# Patient Record
Sex: Female | Born: 1975 | Race: White | Hispanic: No | Marital: Married | Smoking: Current every day smoker
Health system: Southern US, Community
[De-identification: ages and names within clinical notes are randomized; demographics above are authoritative.]

## PROBLEM LIST (undated history)

## (undated) DIAGNOSIS — N200 Calculus of kidney: Secondary | ICD-10-CM

## (undated) DIAGNOSIS — I10 Essential (primary) hypertension: Secondary | ICD-10-CM

## (undated) DIAGNOSIS — F419 Anxiety disorder, unspecified: Secondary | ICD-10-CM

## (undated) DIAGNOSIS — F32A Depression, unspecified: Secondary | ICD-10-CM

## (undated) DIAGNOSIS — N946 Dysmenorrhea, unspecified: Secondary | ICD-10-CM

## (undated) HISTORY — DX: Dysmenorrhea, unspecified: N94.6

## (undated) HISTORY — DX: Anxiety disorder, unspecified: F41.9

## (undated) HISTORY — DX: Essential (primary) hypertension: I10

## (undated) HISTORY — DX: Depression, unspecified: F32.A

---

## 2011-09-22 DIAGNOSIS — F509 Eating disorder, unspecified: Secondary | ICD-10-CM | POA: Insufficient documentation

## 2011-09-22 DIAGNOSIS — J309 Allergic rhinitis, unspecified: Secondary | ICD-10-CM | POA: Insufficient documentation

## 2011-09-22 DIAGNOSIS — L309 Dermatitis, unspecified: Secondary | ICD-10-CM | POA: Insufficient documentation

## 2011-09-22 DIAGNOSIS — F3342 Major depressive disorder, recurrent, in full remission: Secondary | ICD-10-CM | POA: Insufficient documentation

## 2011-09-22 DIAGNOSIS — M545 Low back pain, unspecified: Secondary | ICD-10-CM | POA: Insufficient documentation

## 2011-09-22 DIAGNOSIS — F331 Major depressive disorder, recurrent, moderate: Secondary | ICD-10-CM | POA: Insufficient documentation

## 2011-12-01 DIAGNOSIS — I10 Essential (primary) hypertension: Secondary | ICD-10-CM | POA: Insufficient documentation

## 2012-10-29 DIAGNOSIS — E269 Hyperaldosteronism, unspecified: Secondary | ICD-10-CM | POA: Insufficient documentation

## 2015-01-20 DIAGNOSIS — Z8742 Personal history of other diseases of the female genital tract: Secondary | ICD-10-CM | POA: Insufficient documentation

## 2015-01-20 DIAGNOSIS — R87610 Atypical squamous cells of undetermined significance on cytologic smear of cervix (ASC-US): Secondary | ICD-10-CM | POA: Insufficient documentation

## 2015-06-08 DIAGNOSIS — M84375A Stress fracture, left foot, initial encounter for fracture: Secondary | ICD-10-CM | POA: Insufficient documentation

## 2015-09-11 DIAGNOSIS — J209 Acute bronchitis, unspecified: Secondary | ICD-10-CM | POA: Insufficient documentation

## 2015-09-11 DIAGNOSIS — N200 Calculus of kidney: Secondary | ICD-10-CM | POA: Insufficient documentation

## 2015-09-11 DIAGNOSIS — I152 Hypertension secondary to endocrine disorders: Secondary | ICD-10-CM | POA: Insufficient documentation

## 2015-09-21 DIAGNOSIS — Z789 Other specified health status: Secondary | ICD-10-CM | POA: Insufficient documentation

## 2015-10-19 DIAGNOSIS — R790 Abnormal level of blood mineral: Secondary | ICD-10-CM | POA: Insufficient documentation

## 2016-03-18 DIAGNOSIS — G4700 Insomnia, unspecified: Secondary | ICD-10-CM | POA: Insufficient documentation

## 2016-07-04 DIAGNOSIS — N926 Irregular menstruation, unspecified: Secondary | ICD-10-CM | POA: Insufficient documentation

## 2017-01-16 ENCOUNTER — Emergency Department (HOSPITAL_COMMUNITY): Payer: BLUE CROSS/BLUE SHIELD

## 2017-01-16 ENCOUNTER — Emergency Department (HOSPITAL_COMMUNITY)
Admission: EM | Admit: 2017-01-16 | Discharge: 2017-01-16 | Disposition: A | Discharge status: Home / Self Care | Payer: BLUE CROSS/BLUE SHIELD | Attending: Emergency Medicine | Admitting: Emergency Medicine

## 2017-01-16 ENCOUNTER — Encounter (HOSPITAL_COMMUNITY): Payer: Self-pay | Admitting: Emergency Medicine

## 2017-01-16 DIAGNOSIS — R109 Unspecified abdominal pain: Secondary | ICD-10-CM | POA: Diagnosis present

## 2017-01-16 DIAGNOSIS — N201 Calculus of ureter: Secondary | ICD-10-CM | POA: Insufficient documentation

## 2017-01-16 DIAGNOSIS — Z87891 Personal history of nicotine dependence: Secondary | ICD-10-CM | POA: Insufficient documentation

## 2017-01-16 HISTORY — DX: Calculus of kidney: N20.0

## 2017-01-16 LAB — URINALYSIS, ROUTINE W REFLEX MICROSCOPIC
Bilirubin Urine: NEGATIVE
Glucose, UA: NEGATIVE mg/dL
Ketones, ur: NEGATIVE mg/dL
Leukocytes, UA: NEGATIVE
Nitrite: NEGATIVE
Protein, ur: 30 mg/dL — AB
Specific Gravity, Urine: 1.018 (ref 1.005–1.030)
pH: 8 (ref 5.0–8.0)

## 2017-01-16 LAB — POC URINE PREG, ED: Preg Test, Ur: NEGATIVE

## 2017-01-16 MED ORDER — HYDROMORPHONE HCL 1 MG/ML IJ SOLN
1.0000 mg | Freq: Once | INTRAMUSCULAR | Status: AC
  Administered 2017-01-16: 1 mg via INTRAVENOUS
  Filled 2017-01-16: qty 1

## 2017-01-16 MED ORDER — KETOROLAC TROMETHAMINE 30 MG/ML IJ SOLN
30.0000 mg | Freq: Once | INTRAMUSCULAR | Status: AC
  Administered 2017-01-16: 30 mg via INTRAVENOUS
  Filled 2017-01-16: qty 1

## 2017-01-16 MED ORDER — OXYCODONE-ACETAMINOPHEN 5-325 MG PO TABS: 1.0000 | ORAL_TABLET | Freq: Four times a day (QID) | ORAL | 0 refills | Status: DC | PRN

## 2017-01-16 MED ORDER — PROMETHAZINE HCL 25 MG PO TABS: 25.0000 mg | ORAL_TABLET | Freq: Three times a day (TID) | ORAL | 0 refills | Status: DC | PRN

## 2017-01-16 MED ORDER — ONDANSETRON HCL 4 MG/2ML IJ SOLN
4.0000 mg | Freq: Once | INTRAMUSCULAR | Status: AC
Start: 2017-01-16 — End: 2017-01-16
  Administered 2017-01-16: 4 mg via INTRAVENOUS
  Filled 2017-01-16: qty 2

## 2017-01-16 NOTE — ED Provider Notes (Signed)
[facility]-EMERGENCY DEPT Provider Note   CSN: [id number] Arrival date & time: 01/16/17  0714     History   Chief Complaint Chief Complaint  Patient presents with  . Flank Pain    HPI Brooke Aguilar is a 41 y.o. female.  HPI Patient presents to the emergency department with left flank pain that started late last night and got worse throughout the evening.  The patient states that she took Depo-Provera without significant relief of her symptoms.  Patient, states she has had a ureteral stone in the past about 2 years ago.  States this feels similar.  The patient, states she has also had nausea but no vomiting.  Patient states that nothing seems make the condition better or worse The patient denies chest pain, shortness of breath, headache,blurred vision, neck pain, fever, cough, weakness, numbness, dizziness, anorexia, edema, vomiting, diarrhea, rash, back pain, dysuria, hematemesis, bloody stool, near syncope, or syncope. Past Medical History:  Diagnosis Date  . Kidney stone     There are no active problems to display for this patient.   History reviewed. No pertinent surgical history.  OB History    No data available       Home Medications    Prior to Admission medications   Not on File    Family History No family history on file.  Social History Social History  Substance Use Topics  . Smoking status: Former Smoker    Quit date: 07/31/2014  . Smokeless tobacco: Never Used  . Alcohol use Yes     Comment: 2 per day     Allergies   Patient has no known allergies.   Review of Systems Review of Systems   Physical Exam Updated Vital Signs BP (!) 143/88   Pulse 81   Temp 98.1 F (36.7 C) (Oral)   Resp 16   Ht 5\' 3"  (1.6 m)   Wt 56.7 kg (125 lb)   LMP 01/02/2017   SpO2 100%   BMI 22.14 kg/m   Physical Exam  Constitutional: She is oriented to person, place, and time. She appears well-developed and well-nourished. No distress.  HENT:  Head:  Normocephalic and atraumatic.  Mouth/Throat: Oropharynx is clear and moist.  Eyes: Pupils are equal, round, and reactive to light.  Neck: Normal range of motion. Neck supple.  Cardiovascular: Normal rate, regular rhythm and normal heart sounds.  Exam reveals no gallop and no friction rub.   No murmur heard. Pulmonary/Chest: Effort normal and breath sounds normal. No respiratory distress. She has no wheezes.  Abdominal: Soft. Bowel sounds are normal. She exhibits no distension. There is no tenderness.  Neurological: She is alert and oriented to person, place, and time. She exhibits normal muscle tone. Coordination normal.  Skin: Skin is warm and dry. Capillary refill takes less than 2 seconds. No rash noted. No erythema.  Psychiatric: She has a normal mood and affect. Her behavior is normal.  Nursing note and vitals reviewed.    ED Treatments / Results  Labs (all labs ordered are listed, but only abnormal results are displayed) Labs Reviewed  URINALYSIS, ROUTINE W REFLEX MICROSCOPIC - Abnormal; Notable for the following:       Result Value   APPearance HAZY (*)    Hgb urine dipstick MODERATE (*)    Protein, ur 30 (*)    Bacteria, UA RARE (*)    Squamous Epithelial / LPF 0-5 (*)    All other components within normal limits  PREGNANCY, URINE  EKG  EKG Interpretation None       Radiology No results found.  Procedures Procedures (including critical care time)  Medications Ordered in ED Medications  HYDROmorphone (DILAUDID) injection 1 mg (1 mg Intravenous Given 01/16/17 0758)  ondansetron (ZOFRAN) injection 4 mg (4 mg Intravenous Given 01/16/17 0745)     Initial Impression / Assessment and Plan / ED Course  I have reviewed the triage vital signs and the nursing notes.  Pertinent labs & imaging results that were available during my care of the patient were reviewed by me and considered in my medical decision making (see chart for details).     8:06 AM the patient was  rechecked.  She states that her pain is considerably better at this point, I advised her we are awaiting CT scan.  Patient voiced an understanding and all questions were answered  10:21 AM the patient has what appears to be a 3 mm stone noted in the left distal ureter.  Patient be given Toradol for further pain control.  We will set her up with an appointment with urology.  Told to return here as needed.  Patient agrees the plan and all questions were answered.  Patient is feeling better at this time  Final Clinical Impressions(s) / ED Diagnoses   Final diagnoses:  None    New Prescriptions New Prescriptions   No medications on file     Charlestine NightLawyer, Eder Macek, PA-C 01/16/17 [address]    Daesia Zylka, Long Hillhristopher, PA-C 01/16/17 1050    Doug SouJacubowitz, Sam, MD 01/16/17 [phone]

## 2017-01-16 NOTE — ED Notes (Signed)
ED Provider at bedside. 

## 2017-01-16 NOTE — Discharge Instructions (Signed)
Follow-up with the urologist provided.  Return here as needed.  Increase your fluid intake. °

## 2017-01-16 NOTE — ED Triage Notes (Signed)
PT reports left flank pain that was present when she woke up this morning. The pain did not wake her up. PT reports left flank pain that worsened significantly when she stood and moved around. PT took advil this AM. PT denies N/V. PT reports she has not urinated this AM.

## 2017-01-16 NOTE — ED Notes (Addendum)
PT MADE AWARE SHE CANNOT DRIVE OR OPERATE MACHINERY FOR THE REST OF THE DAY OR WHILE USING OXYCODONE FOR PAIN. PT HAS NO QUESTIONS AT DISCHARGE. PT DENIES WHEELCHAIR TO WAITING ROOM. PT MADE AWARE THAT PROMETHAZINE MAY MAKE HER DROWSY

## 2018-01-10 DIAGNOSIS — Z87892 Personal history of anaphylaxis: Secondary | ICD-10-CM | POA: Insufficient documentation

## 2018-01-10 DIAGNOSIS — Z720 Tobacco use: Secondary | ICD-10-CM | POA: Insufficient documentation

## 2018-01-11 ENCOUNTER — Other Ambulatory Visit: Payer: Self-pay | Admitting: Family Medicine

## 2018-01-11 DIAGNOSIS — Z1231 Encounter for screening mammogram for malignant neoplasm of breast: Secondary | ICD-10-CM

## 2018-02-01 ENCOUNTER — Ambulatory Visit: Payer: BLUE CROSS/BLUE SHIELD

## 2018-02-14 ENCOUNTER — Ambulatory Visit
Admission: RE | Admit: 2018-02-14 | Discharge: 2018-02-14 | Disposition: A | Discharge status: Home / Self Care | Payer: BLUE CROSS/BLUE SHIELD | Source: Ambulatory Visit | Attending: Family Medicine | Admitting: Family Medicine

## 2018-02-14 DIAGNOSIS — Z1231 Encounter for screening mammogram for malignant neoplasm of breast: Secondary | ICD-10-CM

## 2018-02-21 ENCOUNTER — Ambulatory Visit: Payer: Self-pay | Admitting: Allergy and Immunology

## 2019-01-17 ENCOUNTER — Other Ambulatory Visit: Payer: Self-pay | Admitting: Family Medicine

## 2019-01-17 DIAGNOSIS — Z9289 Personal history of other medical treatment: Secondary | ICD-10-CM

## 2019-03-18 ENCOUNTER — Ambulatory Visit: Payer: BLUE CROSS/BLUE SHIELD

## 2019-04-17 ENCOUNTER — Other Ambulatory Visit: Payer: Self-pay | Admitting: Family Medicine

## 2019-04-17 DIAGNOSIS — M84374A Stress fracture, right foot, initial encounter for fracture: Secondary | ICD-10-CM

## 2019-04-24 ENCOUNTER — Other Ambulatory Visit: Payer: Self-pay

## 2019-04-24 ENCOUNTER — Ambulatory Visit
Admission: RE | Admit: 2019-04-24 | Discharge: 2019-04-24 | Disposition: A | Discharge status: Home / Self Care | Payer: BLUE CROSS/BLUE SHIELD | Source: Ambulatory Visit | Attending: Family Medicine | Admitting: Family Medicine

## 2019-04-24 DIAGNOSIS — Z9289 Personal history of other medical treatment: Secondary | ICD-10-CM

## 2019-06-25 ENCOUNTER — Other Ambulatory Visit: Payer: BLUE CROSS/BLUE SHIELD

## 2019-09-04 ENCOUNTER — Other Ambulatory Visit: Payer: Self-pay

## 2019-09-04 ENCOUNTER — Ambulatory Visit
Admission: RE | Admit: 2019-09-04 | Discharge: 2019-09-04 | Disposition: A | Discharge status: Home / Self Care | Payer: BC Managed Care – PPO | Source: Ambulatory Visit | Attending: Family Medicine | Admitting: Family Medicine

## 2019-09-04 DIAGNOSIS — M84374A Stress fracture, right foot, initial encounter for fracture: Secondary | ICD-10-CM

## 2019-09-26 ENCOUNTER — Encounter: Payer: Self-pay | Admitting: Neurology

## 2019-09-27 ENCOUNTER — Other Ambulatory Visit: Payer: Self-pay

## 2019-09-27 DIAGNOSIS — G5603 Carpal tunnel syndrome, bilateral upper limbs: Secondary | ICD-10-CM

## 2019-10-15 ENCOUNTER — Ambulatory Visit (INDEPENDENT_AMBULATORY_CARE_PROVIDER_SITE_OTHER): Payer: BC Managed Care – PPO | Admitting: Neurology

## 2019-10-15 ENCOUNTER — Other Ambulatory Visit: Payer: Self-pay

## 2019-10-15 DIAGNOSIS — G5623 Lesion of ulnar nerve, bilateral upper limbs: Secondary | ICD-10-CM

## 2019-10-15 DIAGNOSIS — G5603 Carpal tunnel syndrome, bilateral upper limbs: Secondary | ICD-10-CM

## 2019-10-15 NOTE — Procedures (Signed)
[facility]  [address], [address]  [place], [place] [address] Tel: [phone] Fax:  [phone] Test Date:  10/15/2019  Patient: Brooke Aguilar DOB: 12-22-1975 Physician: Narda Amber, DO  Sex: Female Height: 5\' 4"  Ref Phys: Elvia Collum, MD  ID#: [id number] Temp: 35.0C Technician:    Patient Complaints: This is a 44 year old female referred for evaluation of bilateral wrist numbness and tingling.  NCV & EMG Findings: Extensive electrodiagnostic testing of the right upper extremity and additional studies of the left shows:  1. Bilateral median, ulnar, and mixed palmar sensory responses are within normal limits. 2. Bilateral median motor responses are within normal limits.  Bilateral ulnar motor responses show decreased conduction velocity across the elbow (A Elbow-B Elbow, L48, R48 m/s).   3. There is no evidence of active or chronic motor axonal loss changes affecting any of the tested muscles.  Motor unit configuration and recruitment pattern is within normal limits.    Impression: 1. Bilateral ulnar neuropathy with slowing across the elbow, purely demyelinating, mild. 2. There is no evidence of a cervical radiculopathy or carpal tunnel syndrome affecting either upper extremity.   ___________________________ Narda Amber, DO    Nerve Conduction Studies Anti Sensory Summary Table   Stim Site NR Peak (ms) Norm Peak (ms) P-T Amp (V) Norm P-T Amp  Left Median Anti Sensory (2nd Digit)  35C  Wrist    2.7 <3.4 51.0 >20  Right Median Anti Sensory (2nd Digit)  35C  Wrist    2.7 <3.4 45.3 >20  Left Ulnar Anti Sensory (5th Digit)  35C  Wrist    2.9 <3.1 31.1 >12  Right Ulnar Anti Sensory (5th Digit)  35C  Wrist    2.7 <3.1 32.2 >12   Motor Summary Table   Stim Site NR Onset (ms) Norm Onset (ms) O-P Amp (mV) Norm O-P Amp Site1 Site2 Delta-0 (ms) Dist (cm) Vel (m/s) Norm Vel (m/s)  Left Median Motor (Abd Poll Brev)  35C  Wrist    2.4 <3.9 11.4 >6  Elbow Wrist 4.6 27.0 59 >50  Elbow    7.0  10.8         Right Median Motor (Abd Poll Brev)  35C  Wrist    2.5 <3.9 10.6 >6 Elbow Wrist 4.3 27.0 63 >50  Elbow    6.8  9.5  Axilla Elbow 0.1 0.0    Axilla    6.7  9.9         Left Ulnar Motor (Abd Dig Minimi)  35C  Wrist    2.4 <3.1 10.6 >7 B Elbow Wrist 3.2 22.0 69 >50  B Elbow    5.6  9.8  A Elbow B Elbow 2.1 10.0 48 >50  A Elbow    7.7  9.7         Right Ulnar Motor (Abd Dig Minimi)  35C  Wrist    2.1 <3.1 9.9 >7 B Elbow Wrist 3.1 21.0 68 >50  B Elbow    5.2  9.5  A Elbow B Elbow 2.1 10.0 48 >50  A Elbow    7.3  8.9          Comparison Summary Table   Stim Site NR Peak (ms) Norm Peak (ms) P-T Amp (V) Site1 Site2 Delta-P (ms) Norm Delta (ms)  Left Median/Ulnar Palm Comparison (Wrist - 8cm)  35C  Median Palm    1.6 <2.2 77.6 Median Palm Ulnar Palm 0.1   Ulnar [employer]  1.7 <2.2 35.3      Right Median/Ulnar Palm Comparison (Wrist - 8cm)  35C  Median Palm    1.6 <2.2 45.8 Median Palm Ulnar Palm 0.0   Ulnar Palm    1.6 <2.2 18.9       EMG   Side Muscle Ins Act Fibs Psw Fasc Number Recrt Dur Dur. Amp Amp. Poly Poly. Comment  Right 1stDorInt Nml Nml Nml Nml Nml Nml Nml Nml Nml Nml Nml Nml N/A  Right PronatorTeres Nml Nml Nml Nml Nml Nml Nml Nml Nml Nml Nml Nml N/A  Right Biceps Nml Nml Nml Nml Nml Nml Nml Nml Nml Nml Nml Nml N/A  Right Triceps Nml Nml Nml Nml Nml Nml Nml Nml Nml Nml Nml Nml N/A  Right Deltoid Nml Nml Nml Nml Nml Nml Nml Nml Nml Nml Nml Nml N/A  Right FlexCarpiUln Nml Nml Nml Nml Nml Nml Nml Nml Nml Nml Nml Nml N/A  Left 1stDorInt Nml Nml Nml Nml Nml Nml Nml Nml Nml Nml Nml Nml N/A  Left PronatorTeres Nml Nml Nml Nml Nml Nml Nml Nml Nml Nml Nml Nml N/A  Left Biceps Nml Nml Nml Nml Nml Nml Nml Nml Nml Nml Nml Nml N/A  Left Triceps Nml Nml Nml Nml Nml Nml Nml Nml Nml Nml Nml Nml N/A  Left Deltoid Nml Nml Nml Nml Nml Nml Nml Nml Nml Nml Nml Nml N/A  Left FlexCarpiUln Nml Nml Nml Nml Nml Nml Nml Nml Nml Nml Nml Nml N/A        Waveforms:

## 2020-03-18 ENCOUNTER — Other Ambulatory Visit: Payer: Self-pay | Admitting: Family Medicine

## 2020-03-18 DIAGNOSIS — Z1231 Encounter for screening mammogram for malignant neoplasm of breast: Secondary | ICD-10-CM

## 2020-04-24 ENCOUNTER — Ambulatory Visit
Admission: RE | Admit: 2020-04-24 | Discharge: 2020-04-24 | Disposition: A | Discharge status: Home / Self Care | Payer: BC Managed Care – PPO | Source: Ambulatory Visit | Attending: Family Medicine | Admitting: Family Medicine

## 2020-04-24 ENCOUNTER — Other Ambulatory Visit: Payer: Self-pay

## 2020-04-24 DIAGNOSIS — Z1231 Encounter for screening mammogram for malignant neoplasm of breast: Secondary | ICD-10-CM

## 2020-10-07 ENCOUNTER — Encounter: Payer: Self-pay | Admitting: Obstetrics and Gynecology

## 2020-10-07 ENCOUNTER — Other Ambulatory Visit (HOSPITAL_COMMUNITY)
Admission: RE | Admit: 2020-10-07 | Discharge: 2020-10-07 | Disposition: A | Discharge status: Home / Self Care | Payer: BC Managed Care – PPO | Source: Ambulatory Visit | Attending: Obstetrics and Gynecology | Admitting: Obstetrics and Gynecology

## 2020-10-07 ENCOUNTER — Other Ambulatory Visit: Payer: Self-pay

## 2020-10-07 ENCOUNTER — Ambulatory Visit: Payer: BC Managed Care – PPO | Admitting: Obstetrics and Gynecology

## 2020-10-07 ENCOUNTER — Encounter: Payer: BC Managed Care – PPO | Admitting: Obstetrics and Gynecology

## 2020-10-07 VITALS — BP 116/72 | HR 72 | Ht 64.0 in

## 2020-10-07 DIAGNOSIS — Z01419 Encounter for gynecological examination (general) (routine) without abnormal findings: Secondary | ICD-10-CM | POA: Diagnosis not present

## 2020-10-07 DIAGNOSIS — Z124 Encounter for screening for malignant neoplasm of cervix: Secondary | ICD-10-CM | POA: Insufficient documentation

## 2020-10-07 DIAGNOSIS — Z3009 Encounter for other general counseling and advice on contraception: Secondary | ICD-10-CM

## 2020-10-07 DIAGNOSIS — Z1211 Encounter for screening for malignant neoplasm of colon: Secondary | ICD-10-CM

## 2020-10-07 DIAGNOSIS — F17200 Nicotine dependence, unspecified, uncomplicated: Secondary | ICD-10-CM | POA: Diagnosis not present

## 2020-10-07 NOTE — Progress Notes (Signed)
45 y.o. G0. Married White or Caucasian Not Hispanic or Latino female here for annual exam. She is having irregular periods. She would like to check her hormones. She is have lower right abdominal pain.  In the past year her cycle has gotten closer together. Period Cycle (Days): 23 Period Duration (Days): 4 Period Pattern: (!) Irregular Menstrual Flow: Heavy Menstrual Control: Tampon Menstrual Control Change Freq (Hours): 3 Dysmenorrhea: (!) Mild Dysmenorrhea Symptoms: Cramping  Rare vasomotor symptoms. No dyspareunia.   She has a h/o focal vulvar itching, prior biopsy with eczema. She has a steroid cream to use as needed.   For the past 1-2 weeks she c/o pain in her "right ovary". The pain is a 5/10, constant. Hurts more if she valsalvas.    Patient's last menstrual period was 09/18/2020.          Sexually active: Yes.    The current method of family planning is none.    Exercising: Yes.    Has an active job. Smoker:  Yes, 2-5 cigarettes a day  Health Maintenance: Pap:  2020 normal  History of abnormal Pap:  Yes had colpo  MMG:  04/24/20 WNL BMD:   None  Colonoscopy: none  She has the cologuard test and will do it.  TDaP:  10/25/13 Gardasil: NA   reports that she quit smoking about 6 years ago. She has never used smokeless tobacco. She reports current alcohol use. She reports that she does not use drugs.  Past Medical History:  Diagnosis Date  . Anxiety   . Depression   . Dysmenorrhea   . Hypertension   . Kidney stone     No past surgical history on file.  Current Outpatient Medications  Medication Sig Dispense Refill  . amLODipine (NORVASC) 2.5 MG tablet Take 2.5 mg by mouth daily.  3  . Ascorbic Acid (VITAMIN C) 100 MG tablet Take 100 mg by mouth daily.    Marland Kitchen b complex vitamins tablet Take 1 tablet by mouth daily.    Marland Kitchen BIOTIN PO Take 1 tablet by mouth daily.    . diphenhydrAMINE (BENADRYL) 25 MG tablet Take 25 mg by mouth every 6 (six) hours as needed for allergies.     Marland Kitchen escitalopram (LEXAPRO) 20 MG tablet Take 20 mg by mouth daily.  3  . Hypromellose (ARTIFICIAL TEARS OP) Place 1 drop into both eyes daily as needed (dry eyes).    Marland Kitchen ibuprofen (ADVIL,MOTRIN) 200 MG tablet Take 200 mg by mouth every 6 (six) hours as needed for moderate pain.    Marland Kitchen LUTEIN PO Take 1 tablet by mouth daily.    . Multiple Vitamin (MULTIVITAMIN) tablet Take 1 tablet by mouth daily.    . traZODone (DESYREL) 50 MG tablet TAKE 1-2 TABLETS (50-100 MG TOTAL) BY MOUTH NIGHTLY AS NEEDED  FOR SLEEP.    Marland Kitchen VITAMIN A PO Take 1 tablet by mouth daily.     No current facility-administered medications for this visit.    Family History  Problem Relation Age of Onset  . Breast cancer Neg Hx     Review of Systems  Constitutional: Negative.   HENT: Negative.   Eyes: Negative.   Respiratory: Negative.   Cardiovascular: Negative.   Gastrointestinal: Negative.   Genitourinary: Negative.   Allergic/Immunologic: Negative.   Neurological: Negative.   Hematological: Negative.     Exam:   Pulse 72   Ht 5\' 4"  (1.626 m)   LMP 09/18/2020   SpO2 99%   BMI 21.46  kg/m   Weight change: @WEIGHTCHANGE @ Height:   Height: 5\' 4"  (162.6 cm)  Ht Readings from Last 3 Encounters:  10/07/20 5\' 4"  (1.626 m)  01/16/17 5\' 3"  (1.6 m)  BP 116/72  General appearance: alert, cooperative and appears stated age Head: Normocephalic, without obvious abnormality, atraumatic Neck: no adenopathy, supple, symmetrical, trachea midline and thyroid normal to inspection and palpation Lungs: clear to auscultation bilaterally Cardiovascular: regular rate and rhythm Breasts: normal appearance, no masses or tenderness Abdomen: soft, non-tender; non distended,  no masses,  no organomegaly Extremities: extremities normal, atraumatic, no cyanosis or edema Skin: Skin color, texture, turgor normal. No rashes or lesions Lymph nodes: Cervical, supraclavicular, and axillary nodes normal. No abnormal inguinal nodes  palpated Neurologic: Grossly normal   Pelvic: External genitalia:  no lesions              Urethra:  normal appearing urethra with no masses, tenderness or lesions              Bartholins and Skenes: normal                 Vagina: normal appearing vagina with normal color and discharge, no lesions              Cervix: no lesions               Bimanual Exam:  Uterus:  normal size, contour, position, consistency, mobility, non-tender and anteverted              Adnexa: no mass, fullness, tenderness               Rectovaginal: Confirms               Anus:  normal sphincter tone, no lesions  10/09/20 chaperoned for the exam.  1. Well woman exam Discussed breast self exam Discussed calcium and vit D intake Mammogram utd  2. Screening for cervical cancer - Cytology - PAP with hpv  3. Colon cancer screening Patient has the cologuard at home, will do it  4. Smoker   5. General counseling and advice on female contraception  - IUD Insertion; Future

## 2020-10-07 NOTE — Patient Instructions (Addendum)
EXERCISE   We recommended that you start or continue a regular exercise program for good health. Physical activity is anything that gets your body moving, some is better than none. The CDC recommends 150 minutes per week of Moderate-Intensity Aerobic Activity and 2 or more days of Muscle Strengthening Activity.  Benefits of exercise are limitless: helps weight loss/weight maintenance, improves mood and energy, helps with depression and anxiety, improves sleep, tones and strengthens muscles, improves balance, improves bone density, protects from chronic conditions such as heart disease, high blood pressure and diabetes and so much more. To learn more visit: https://www.cdc.gov/physicalactivity/index.html  DIET: Good nutrition starts with a healthy diet of fruits, vegetables, whole grains, and lean protein sources. Drink plenty of water for hydration. Minimize empty calories, sodium, sweets. For more information about dietary recommendations visit: https://health.gov/our-work/nutrition-physical-activity/dietary-guidelines and https://www.myplate.gov/  ALCOHOL:  Women should limit their alcohol intake to no more than 7 drinks/beers/glasses of wine (combined, not each!) per week. Moderation of alcohol intake to this level decreases your risk of breast cancer and liver damage.  If you are concerned that you may have a problem, or your friends have told you they are concerned about your drinking, there are many resources to help. A well-known program that is free, effective, and available to all people all over the nation is Alcoholics Anonymous.  Check out this site to learn more: https://www.aa.org/   CALCIUM AND VITAMIN D:  Adequate intake of calcium and Vitamin D are recommended for bone health.  You should be getting between 1000-1200 mg of calcium and 800 units of Vitamin D daily between diet and supplements  PAP SMEARS:  Pap smears, to check for cervical cancer or precancers,  have traditionally been  done yearly, scientific advances have shown that most women can have pap smears less often.  However, every woman still should have a physical exam from her gynecologist every year. It will include a breast check, inspection of the vulva and vagina to check for abnormal growths or skin changes, a visual exam of the cervix, and then an exam to evaluate the size and shape of the uterus and ovaries. We will also provide age appropriate advice regarding health maintenance, like when you should have certain vaccines, screening for sexually transmitted diseases, bone density testing, colonoscopy, mammograms, etc.   MAMMOGRAMS:  All women over 40 years old should have a routine mammogram.   COLON CANCER SCREENING: Now recommend starting at age 45. At this time colonoscopy is not covered for routine screening until 50. There are take home tests that can be done between 45-49.   COLONOSCOPY:  Colonoscopy to screen for colon cancer is recommended for all women at age 50.  We know, you hate the idea of the prep.  We agree, BUT, having colon cancer and not knowing it is worse!!  Colon cancer so often starts as a polyp that can be seen and removed at colonscopy, which can quite literally save your life!  And if your first colonoscopy is normal and you have no family history of colon cancer, most women don't have to have it again for 10 years.  Once every ten years, you can do something that may end up saving your life, right?  We will be happy to help you get it scheduled when you are ready.  Be sure to check your insurance coverage so you understand how much it will cost.  It may be covered as a preventative service at no cost, but you should check   your particular policy.      Breast Self-Awareness Breast self-awareness means being familiar with how your breasts look and feel. It involves checking your breasts regularly and reporting any changes to your health care provider. Practicing breast self-awareness is  important. A change in your breasts can be a sign of a serious medical problem. Being familiar with how your breasts look and feel allows you to find any problems early, when treatment is more likely to be successful. All women should practice breast self-awareness, including women who have had breast implants. How to do a breast self-exam One way to learn what is normal for your breasts and whether your breasts are changing is to do a breast self-exam. To do a breast self-exam: Look for Changes  1. Remove all the clothing above your waist. 2. Stand in front of a mirror in a room with good lighting. 3. Put your hands on your hips. 4. Push your hands firmly downward. 5. Compare your breasts in the mirror. Look for differences between them (asymmetry), such as: ? Differences in shape. ? Differences in size. ? Puckers, dips, and bumps in one breast and not the other. 6. Look at each breast for changes in your skin, such as: ? Redness. ? Scaly areas. 7. Look for changes in your nipples, such as: ? Discharge. ? Bleeding. ? Dimpling. ? Redness. ? A change in position. Feel for Changes Carefully feel your breasts for lumps and changes. It is best to do this while lying on your back on the floor and again while sitting or standing in the shower or tub with soapy water on your skin. Feel each breast in the following way:  Place the arm on the side of the breast you are examining above your head.  Feel your breast with the other hand.  Start in the nipple area and make  inch (2 cm) overlapping circles to feel your breast. Use the pads of your three middle fingers to do this. Apply light pressure, then medium pressure, then firm pressure. The light pressure will allow you to feel the tissue closest to the skin. The medium pressure will allow you to feel the tissue that is a little deeper. The firm pressure will allow you to feel the tissue close to the ribs.  Continue the overlapping circles,  moving downward over the breast until you feel your ribs below your breast.  Move one finger-width toward the center of the body. Continue to use the  inch (2 cm) overlapping circles to feel your breast as you move slowly up toward your collarbone.  Continue the up and down exam using all three pressures until you reach your armpit.  Write Down What You Find  Write down what is normal for each breast and any changes that you find. Keep a written record with breast changes or normal findings for each breast. By writing this information down, you do not need to depend only on memory for size, tenderness, or location. Write down where you are in your menstrual cycle, if you are still menstruating. If you are having trouble noticing differences in your breasts, do not get discouraged. With time you will become more familiar with the variations in your breasts and more comfortable with the exam. How often should I examine my breasts? Examine your breasts every month. If you are breastfeeding, the best time to examine your breasts is after a feeding or after using a breast pump. If you menstruate, the best time to   examine your breasts is 5-7 days after your period is over. During your period, your breasts are lumpier, and it may be more difficult to notice changes. When should I see my health care provider? See your health care provider if you notice:  A change in shape or size of your breasts or nipples.  A change in the skin of your breast or nipples, such as a reddened or scaly area.  Unusual discharge from your nipples.  A lump or thick area that was not there before.  Pain in your breasts.  Anything that concerns you.  Williams Textbook of Endocrinology (14th ed., pp. 574-641). [place], [place]: Elsevier.">  Perimenopause Perimenopause is the normal time of a woman's life when the levels of estrogen, the female hormone produced by the ovaries, begin to decrease. This leads to changes in  menstrual periods before they stop completely (menopause). Perimenopause can begin 2-8 years before menopause. During perimenopause, the ovaries may or may not produce an egg and a woman can still become pregnant. What are the causes? This condition is caused by a natural change in hormone levels that happens as you get older. What increases the risk? This condition is more likely to start at an earlier age if you have certain medical conditions or have undergone treatments, including:  A tumor of the pituitary gland in the brain.  A disease that affects the ovaries and hormone production.  Certain cancer treatments, such as chemotherapy or hormone therapy, or radiation therapy on the pelvis.  Heavy smoking and excessive alcohol use.  Family history of early menopause. What are the signs or symptoms? Perimenopausal changes affect each woman differently. Symptoms of this condition may include:  Hot flashes.  Irregular menstrual periods.  Night sweats.  Changes in feelings about sex. This could be a decrease in sex drive or an increased discomfort around your sexuality.  Vaginal dryness.  Headaches.  Mood swings.  Depression.  Problems sleeping (insomnia).  Memory problems or trouble concentrating.  Irritability.  Tiredness.  Weight gain.  Anxiety.  Trouble getting pregnant. How is this diagnosed? This condition is diagnosed based on your medical history, a physical exam, your age, your menstrual history, and your symptoms. Hormone tests may also be done. How is this treated? In some cases, no treatment is needed. You and your health care provider should make a decision together about whether treatment is necessary. Treatment will be based on your individual condition and preferences. Various treatments are available, such as:  Menopausal hormone therapy (MHT).  Medicines to treat specific symptoms.  Acupuncture.  Vitamin or herbal supplements. Before starting  treatment, make sure to let your health care provider know if you have a personal or family history of:  Heart disease.  Breast cancer.  Blood clots.  Diabetes.  Osteoporosis. Follow these instructions at home: Medicines  Take over-the-counter and prescription medicines only as told by your health care provider.  Take vitamin supplements only as told by your health care provider.  Talk with your health care provider before starting any herbal supplements. Lifestyle  Do not use any products that contain nicotine or tobacco, such as cigarettes, e-cigarettes, and chewing tobacco. If you need help quitting, ask your health care provider.  Get at least 30 minutes of physical activity on 5 or more days each week.  Eat a balanced diet that includes fresh fruits and vegetables, whole grains, soybeans, eggs, lean meat, and low-fat dairy.  Avoid alcoholic and caffeinated beverages, as well as spicy foods.   This may help prevent hot flashes.  Get 7-8 hours of sleep each night.  Dress in layers that can be removed to help you manage hot flashes.  Find ways to manage stress, such as deep breathing, meditation, or journaling.   General instructions  Keep track of your menstrual periods, including: ? When they occur. ? How heavy they are and how long they last. ? How much time passes between periods.  Keep track of your symptoms, noting when they start, how often you have them, and how long they last.  Use vaginal lubricants or moisturizers to help with vaginal dryness and improve comfort during sex.  You can still become pregnant if you are having irregular periods. Make sure you use contraception during perimenopause if you do not want to get pregnant.  Keep all follow-up visits. This is important. This includes any group therapy or counseling.   Contact a health care provider if:  You have heavy vaginal bleeding or pass blood clots.  Your period lasts more than 2 days longer  than normal.  Your periods are recurring sooner than 21 days.  You bleed after having sex.  You have pain during sex. Get help right away if you have:  Chest pain, trouble breathing, or trouble talking.  Severe depression.  Pain when you urinate.  Severe headaches.  Vision problems. Summary  Perimenopause is the time when a woman's body begins to move into menopause. This may happen naturally or as a result of other health problems or medical treatments.  Perimenopause can begin 2-8 years before menopause, and it can last for several years.  Perimenopausal symptoms can be managed through medicines, lifestyle changes, and complementary therapies such as acupuncture. This information is not intended to replace advice given to you by your health care provider. Make sure you discuss any questions you have with your health care provider. Document Revised: 01/16/2020 Document Reviewed: 01/16/2020 Elsevier Patient Education  2021 Elsevier Inc.  

## 2020-10-12 LAB — CYTOLOGY - PAP
Comment: NEGATIVE
Diagnosis: NEGATIVE
High risk HPV: NEGATIVE

## 2020-10-19 ENCOUNTER — Other Ambulatory Visit: Payer: Self-pay

## 2020-10-19 ENCOUNTER — Ambulatory Visit (INDEPENDENT_AMBULATORY_CARE_PROVIDER_SITE_OTHER): Payer: BC Managed Care – PPO | Admitting: Obstetrics and Gynecology

## 2020-10-19 ENCOUNTER — Encounter: Payer: Self-pay | Admitting: Obstetrics and Gynecology

## 2020-10-19 VITALS — BP 104/66 | HR 69 | Ht 64.0 in

## 2020-10-19 DIAGNOSIS — Z30430 Encounter for insertion of intrauterine contraceptive device: Secondary | ICD-10-CM | POA: Diagnosis not present

## 2020-10-19 DIAGNOSIS — Z3009 Encounter for other general counseling and advice on contraception: Secondary | ICD-10-CM

## 2020-10-19 DIAGNOSIS — N882 Stricture and stenosis of cervix uteri: Secondary | ICD-10-CM

## 2020-10-19 LAB — PREGNANCY, URINE: Preg Test, Ur: NEGATIVE

## 2020-10-19 NOTE — Progress Notes (Signed)
GYNECOLOGY  VISIT   HPI: 45 y.o.   Married White or Caucasian Not Hispanic or Latino  female   G0P0000 with Patient's last menstrual period was 10/09/2020.   here for mirena iud insertion   GYNECOLOGIC HISTORY: Patient's last menstrual period was 10/09/2020. Contraception: none  Menopausal hormone therapy: none        OB History    Gravida  0   Para  0   Term  0   Preterm  0   AB  0   Living  0     SAB  0   IAB  0   Ectopic  0   Multiple  0   Live Births  0              Patient Active Problem List   Diagnosis Date Noted  . History of anaphylaxis 01/10/2018  . Late menses 07/04/2016  . Abnormal iron saturation 10/19/2015  . Vegan diet 09/21/2015  . Acute bronchitis 09/11/2015  . 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 Nephrolithiasis 09/11/2015  . Metatarsal stress fracture of left foot 06/08/2015  . Atypical squamous cell changes of undetermined significance (ASCUS) on cervical cytology with positive high risk human papilloma virus (HPV) 01/20/2015  . Atypical squamous cells of undetermined significance on cytologic smear of cervix (ASC-US) 01/20/2015  . 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 Essential hypertension 12/01/2011  . Eating disorder 09/22/2011  . Eczema 09/22/2011  . Moderate episode of recurrent major depressive disorder (HCC) 09/22/2011  . Allergic rhinitis, unspecified 09/22/2011    Past Medical History:  Diagnosis Date  . Anxiety   . Depression   . Dysmenorrhea   . Hypertension   . Kidney stone     History reviewed. No pertinent surgical history.  Current Outpatient Medications  Medication Sig Dispense Refill  . amLODipine (NORVASC) 2.5 MG tablet Take 2.5 mg by mouth daily.  3  . Ascorbic Acid (VITAMIN C) 100 MG tablet Take 100 mg by mouth daily.    Marland Kitchen b complex vitamins tablet Take 1 tablet by mouth daily.    Marland Kitchen BIOTIN PO Take 1 tablet by mouth daily.    . diphenhydrAMINE (BENADRYL) 25 MG tablet Take 25 mg by  mouth every 6 (six) hours as needed for allergies.    Marland Kitchen escitalopram (LEXAPRO) 20 MG tablet Take 20 mg by mouth daily.  3  . Hypromellose (ARTIFICIAL TEARS OP) Place 1 drop into both eyes daily as needed (dry eyes).    Marland Kitchen ibuprofen (ADVIL,MOTRIN) 200 MG tablet Take 200 mg by mouth every 6 (six) hours as needed for moderate pain.    Marland Kitchen LUTEIN PO Take 1 tablet by mouth daily.    . Multiple Vitamin (MULTIVITAMIN) tablet Take 1 tablet by mouth daily.    . traZODone (DESYREL) 50 MG tablet TAKE 1-2 TABLETS (50-100 MG TOTAL) BY MOUTH NIGHTLY AS NEEDED  FOR SLEEP.    Marland Kitchen VITAMIN A PO Take 1 tablet by mouth daily.     No current facility-administered medications for this visit.     ALLERGIES: Patient has no known allergies.  Family History  Problem Relation Age of Onset  . Breast cancer Neg Hx     Social History   Socioeconomic History  . Marital status: Married    Spouse name: Not on file  . Number of children: Not on file  . Years of education: Not on file  . Highest education level: Not on file  Occupational History  . Not  on file  Tobacco Use  . Smoking status: Former Smoker    Quit date: 07/31/2014    Years since quitting: 6.2  . Smokeless tobacco: Never Used  Vaping Use  . Vaping Use: Never used  Substance and Sexual Activity  . Alcohol use: Yes    Comment: 2 per day  . Drug use: No  . Sexual activity: Yes    Birth control/protection: None  Other Topics Concern  . Not on file  Social History Narrative  . Not on file   Social Determinants of Health   Financial Resource Strain: Not on file  Food Insecurity: Not on file  Transportation Needs: Not on file  Physical Activity: Not on file  Stress: Not on file  Social Connections: Not on file  Intimate Partner Violence: Not on file    Review of Systems  All other systems reviewed and are negative.   PHYSICAL EXAMINATION:    Pulse 69   Ht 5\' 4"  (1.626 m)   LMP 10/09/2020   SpO2 97%   BMI 21.46 kg/m     General  appearance: alert, cooperative and appears stated age   Pelvic: External genitalia:  no lesions              Urethra:  normal appearing urethra with no masses, tenderness or lesions              Bartholins and Skenes: normal                 Vagina: normal appearing vagina with normal color and discharge, no lesions              Cervix: no lesions   The risks of the mirena IUD were reviewed with the patient, including infection, abnormal bleeding and uterine perfortion. Consent was signed.  A speculum was placed in the vagina, the cervix was cleansed with betadine. A tenaculum was placed on the cervix, the uterus sounded to 8 cm. The cervix was dilated from a mini-dilator to a #5 hagar dilator  The mirena IUD was inserted without difficulty. The string were cut to 3 cm.    The patient tolerated the procedure well.    1. Encounter for IUD insertion Mirena IUD placed Use back up birth control for 1 week  2. General counseling and advice on female contraception - IUD Insertion - Pregnancy, urine, negative  3. Cervical stenosis (uterine cervix) Dilated with mini-dilators

## 2020-10-19 NOTE — Patient Instructions (Signed)

## 2020-11-23 ENCOUNTER — Ambulatory Visit: Payer: BC Managed Care – PPO | Admitting: Obstetrics and Gynecology

## 2020-12-07 ENCOUNTER — Ambulatory Visit: Payer: BC Managed Care – PPO | Admitting: Obstetrics and Gynecology

## 2020-12-07 ENCOUNTER — Other Ambulatory Visit: Payer: Self-pay

## 2020-12-07 ENCOUNTER — Encounter: Payer: Self-pay | Admitting: Obstetrics and Gynecology

## 2020-12-07 VITALS — BP 132/64 | HR 77 | Ht 64.0 in

## 2020-12-07 DIAGNOSIS — Z30431 Encounter for routine checking of intrauterine contraceptive device: Secondary | ICD-10-CM

## 2020-12-07 NOTE — Progress Notes (Signed)
GYNECOLOGY  VISIT   HPI: 45 y.o.   Married White or Caucasian Not Hispanic or Latino  female   G0P0000 with Patient's last menstrual period was 11/06/2020.   here for mirena IUD follow up, placed in 3/22.   Patient states that she has had some cramping and spotting. Symptoms are improving slowly, tolerable.   GYNECOLOGIC HISTORY: Patient's last menstrual period was 11/06/2020. Contraception:IUD Menopausal hormone therapy: IUD        OB History    Gravida  0   Para  0   Term  0   Preterm  0   AB  0   Living  0     SAB  0   IAB  0   Ectopic  0   Multiple  0   Live Births  0              Patient Active Problem List   Diagnosis Date Noted  . History of anaphylaxis 01/10/2018  . Late menses 07/04/2016  . Abnormal iron saturation 10/19/2015  . Vegan diet 09/21/2015  . Acute bronchitis 09/11/2015  . 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 Nephrolithiasis 09/11/2015  . Metatarsal stress fracture of left foot 06/08/2015  . Atypical squamous cell changes of undetermined significance (ASCUS) on cervical cytology with positive high risk human papilloma virus (HPV) 01/20/2015  . Atypical squamous cells of undetermined significance on cytologic smear of cervix (ASC-US) 01/20/2015  . 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 Essential hypertension 12/01/2011  . Eating disorder 09/22/2011  . Eczema 09/22/2011  . Moderate episode of recurrent major depressive disorder (HCC) 09/22/2011  . Allergic rhinitis, unspecified 09/22/2011    Past Medical History:  Diagnosis Date  . Anxiety   . Depression   . Dysmenorrhea   . Hypertension   . Kidney stone     History reviewed. No pertinent surgical history.  Current Outpatient Medications  Medication Sig Dispense Refill  . amLODipine (NORVASC) 2.5 MG tablet Take 2.5 mg by mouth daily.  3  . Ascorbic Acid (VITAMIN C) 100 MG tablet Take 100 mg by mouth daily.    Marland Kitchen b complex vitamins tablet Take 1 tablet by  mouth daily.    Marland Kitchen BIOTIN PO Take 1 tablet by mouth daily.    . diphenhydrAMINE (BENADRYL) 25 MG tablet Take 25 mg by mouth every 6 (six) hours as needed for allergies.    Marland Kitchen escitalopram (LEXAPRO) 20 MG tablet Take 20 mg by mouth daily.  3  . Hypromellose (ARTIFICIAL TEARS OP) Place 1 drop into both eyes daily as needed (dry eyes).    Marland Kitchen ibuprofen (ADVIL,MOTRIN) 200 MG tablet Take 200 mg by mouth every 6 (six) hours as needed for moderate pain.    Marland Kitchen LUTEIN PO Take 1 tablet by mouth daily.    . Multiple Vitamin (MULTIVITAMIN) tablet Take 1 tablet by mouth daily.    . traZODone (DESYREL) 50 MG tablet TAKE 1-2 TABLETS (50-100 MG TOTAL) BY MOUTH NIGHTLY AS NEEDED  FOR SLEEP.    Marland Kitchen VITAMIN A PO Take 1 tablet by mouth daily.     No current facility-administered medications for this visit.     ALLERGIES: Patient has no known allergies.  Family History  Problem Relation Age of Onset  . Breast cancer Neg Hx     Social History   Socioeconomic History  . Marital status: Married    Spouse name: Not on file  . Number of children: Not on file  . Years  of education: Not on file  . Highest education level: Not on file  Occupational History  . Not on file  Tobacco Use  . Smoking status: Former Smoker    Quit date: 07/31/2014    Years since quitting: 6.3  . Smokeless tobacco: Never Used  Vaping Use  . Vaping Use: Never used  Substance and Sexual Activity  . Alcohol use: Yes    Comment: 2 per day  . Drug use: No  . Sexual activity: Yes    Birth control/protection: None  Other Topics Concern  . Not on file  Social History Narrative  . Not on file   Social Determinants of Health   Financial Resource Strain: Not on file  Food Insecurity: Not on file  Transportation Needs: Not on file  Physical Activity: Not on file  Stress: Not on file  Social Connections: Not on file  Intimate Partner Violence: Not on file    Review of Systems  All other systems reviewed and are  negative.   PHYSICAL EXAMINATION:    BP 132/64   Pulse 77   Ht 5\' 4"  (1.626 m)   LMP 11/06/2020   SpO2 100%   BMI 21.46 kg/m     General appearance: alert, cooperative and appears stated age  Pelvic: External genitalia:  no lesions              Urethra:  normal appearing urethra with no masses, tenderness or lesions              Bartholins and Skenes: normal                 Vagina: normal appearing vagina with normal color and discharge, no lesions              Cervix: no lesions and IUD string 3 cm              Bimanual Exam:  Uterus:  normal size, contour, position, consistency, mobility, non-tender              Adnexa: no mass, fullness, tenderness                Chaperone present   1. IUD check up Doing well Routine f/u

## 2021-03-04 ENCOUNTER — Ambulatory Visit: Payer: BC Managed Care – PPO | Admitting: Nurse Practitioner

## 2021-03-19 ENCOUNTER — Other Ambulatory Visit: Payer: Self-pay | Admitting: Family Medicine

## 2021-03-19 DIAGNOSIS — Z1231 Encounter for screening mammogram for malignant neoplasm of breast: Secondary | ICD-10-CM

## 2021-03-22 ENCOUNTER — Ambulatory Visit: Payer: BC Managed Care – PPO | Admitting: Nurse Practitioner

## 2021-03-29 ENCOUNTER — Ambulatory Visit: Payer: BC Managed Care – PPO | Admitting: Obstetrics and Gynecology

## 2021-05-12 ENCOUNTER — Ambulatory Visit: Payer: BC Managed Care – PPO

## 2021-05-18 ENCOUNTER — Ambulatory Visit
Admission: RE | Admit: 2021-05-18 | Discharge: 2021-05-18 | Disposition: A | Discharge status: Home / Self Care | Payer: BC Managed Care – PPO | Source: Ambulatory Visit | Attending: Family Medicine | Admitting: Family Medicine

## 2021-05-18 ENCOUNTER — Other Ambulatory Visit: Payer: Self-pay

## 2021-05-18 DIAGNOSIS — Z1231 Encounter for screening mammogram for malignant neoplasm of breast: Secondary | ICD-10-CM

## 2021-06-18 ENCOUNTER — Ambulatory Visit: Payer: BC Managed Care – PPO | Admitting: Obstetrics and Gynecology

## 2021-06-18 ENCOUNTER — Encounter: Payer: Self-pay | Admitting: Obstetrics and Gynecology

## 2021-06-18 ENCOUNTER — Other Ambulatory Visit: Payer: Self-pay

## 2021-06-18 VITALS — BP 120/78

## 2021-06-18 DIAGNOSIS — Z30431 Encounter for routine checking of intrauterine contraceptive device: Secondary | ICD-10-CM | POA: Diagnosis not present

## 2021-06-18 DIAGNOSIS — R102 Pelvic and perineal pain: Secondary | ICD-10-CM | POA: Diagnosis not present

## 2021-06-18 DIAGNOSIS — R252 Cramp and spasm: Secondary | ICD-10-CM

## 2021-06-18 NOTE — Progress Notes (Signed)
GYNECOLOGY  VISIT   HPI: 45 y.o.   Married White or Caucasian Not Hispanic or Latino  female   G0P0000 with No LMP recorded. (Menstrual status: IUD).   here for cramping since mirena IUD insertion 10-19-20. She spotted for ~6 months, no longer bleeding, no cycles. She has intermittent cramping. She can go weeks without cramping, then can cramp for 2-3 days. Cramping is helped with ibuprofen, can go from her lower abdomen to her back and legs. Can be up to an 8/10 in severity. No pain with sex. No bowel changes.  GYNECOLOGIC HISTORY: No LMP recorded. (Menstrual status: IUD). Contraception:IUD Menopausal hormone therapy: No        OB History     Gravida  0   Para  0   Term  0   Preterm  0   AB  0   Living  0      SAB  0   IAB  0   Ectopic  0   Multiple  0   Live Births  0              Patient Active Problem List   Diagnosis Date Noted   History of anaphylaxis 01/10/2018   Late menses 07/04/2016   Abnormal iron saturation 10/19/2015   Vegan diet 09/21/2015   Acute bronchitis 09/11/2015   Hypertension due to endocrine disorder 09/11/2015   Nephrolithiasis 09/11/2015   Metatarsal stress fracture of left foot 06/08/2015   Atypical squamous cell changes of undetermined significance (ASCUS) on cervical cytology with positive high risk human papilloma virus (HPV) 01/20/2015   Atypical squamous cells of undetermined significance on cytologic smear of cervix (ASC-US) 01/20/2015   Hyperaldosteronism (HCC) 10/29/2012   Essential hypertension 12/01/2011   Eating disorder 09/22/2011   Eczema 09/22/2011   Moderate episode of recurrent major depressive disorder (HCC) 09/22/2011   Allergic rhinitis, unspecified 09/22/2011    Past Medical History:  Diagnosis Date   Anxiety    Depression    Dysmenorrhea    Hypertension    Kidney stone     No past surgical history on file.  Current Outpatient Medications  Medication Sig Dispense Refill   amLODipine (NORVASC) 2.5  MG tablet Take 2.5 mg by mouth daily.  3   Ascorbic Acid (VITAMIN C) 100 MG tablet Take 100 mg by mouth daily.     b complex vitamins tablet Take 1 tablet by mouth daily.     BIOTIN PO Take 1 tablet by mouth daily.     diphenhydrAMINE (BENADRYL) 25 MG tablet Take 25 mg by mouth every 6 (six) hours as needed for allergies.     escitalopram (LEXAPRO) 20 MG tablet Take 20 mg by mouth daily.  3   Hypromellose (ARTIFICIAL TEARS OP) Place 1 drop into both eyes daily as needed (dry eyes).     ibuprofen (ADVIL,MOTRIN) 200 MG tablet Take 200 mg by mouth every 6 (six) hours as needed for moderate pain.     LUTEIN PO Take 1 tablet by mouth daily.     Multiple Vitamin (MULTIVITAMIN) tablet Take 1 tablet by mouth daily.     traZODone (DESYREL) 50 MG tablet TAKE 1-2 TABLETS (50-100 MG TOTAL) BY MOUTH NIGHTLY AS NEEDED  FOR SLEEP.     VITAMIN A PO Take 1 tablet by mouth daily.     No current facility-administered medications for this visit.     ALLERGIES: Patient has no known allergies.  Family History  Problem Relation Age of Onset  Breast cancer Neg Hx     Social History   Socioeconomic History   Marital status: Married    Spouse name: Not on file   Number of children: Not on file   Years of education: Not on file   Highest education level: Not on file  Occupational History   Not on file  Tobacco Use   Smoking status: Former    Types: Cigarettes    Quit date: 07/31/2014    Years since quitting: 6.8   Smokeless tobacco: Never  Vaping Use   Vaping Use: Never used  Substance and Sexual Activity   Alcohol use: Yes    Comment: 2 per day   Drug use: No   Sexual activity: Yes    Birth control/protection: None  Other Topics Concern   Not on file  Social History Narrative   Not on file   Social Determinants of Health   Financial Resource Strain: Not on file  Food Insecurity: Not on file  Transportation Needs: Not on file  Physical Activity: Not on file  Stress: Not on file   Social Connections: Not on file  Intimate Partner Violence: Not on file    ROS  PHYSICAL EXAMINATION:    There were no vitals taken for this visit.    General appearance: alert, cooperative and appears stated age Abdomen: soft, non-tender; non distended, no masses,  no organomegaly  Pelvic: External genitalia:  no lesions              Urethra:  normal appearing urethra with no masses, tenderness or lesions              Bartholins and Skenes: normal                 Vagina: normal appearing vagina with normal color and discharge, no lesions              Cervix: no cervical motion tenderness, no lesions, and IUD string 3 cm              Bimanual Exam:  Uterus:  normal size, contour, position, consistency, mobility, non-tender and anteverted              Adnexa: no mass, fullness, tenderness              Bladder: not tender  Pelvic floor: not tender  Chaperone, Kennon Portela, was present for exam.  1. Pelvic cramping Intermittent cramping since her IUD was placed in 3/22. Normal exam. -Calendar cramping - US PELVIS TRANSVAGINAL NON-OB (TV ONLY); Future

## 2021-06-22 ENCOUNTER — Other Ambulatory Visit: Payer: Self-pay | Admitting: Obstetrics and Gynecology

## 2021-06-22 DIAGNOSIS — R102 Pelvic and perineal pain: Secondary | ICD-10-CM

## 2021-07-20 ENCOUNTER — Other Ambulatory Visit: Payer: Self-pay

## 2021-07-20 ENCOUNTER — Ambulatory Visit (INDEPENDENT_AMBULATORY_CARE_PROVIDER_SITE_OTHER): Payer: BC Managed Care – PPO

## 2021-07-20 DIAGNOSIS — R102 Pelvic and perineal pain: Secondary | ICD-10-CM

## 2021-07-20 DIAGNOSIS — M65311 Trigger thumb, right thumb: Secondary | ICD-10-CM | POA: Insufficient documentation

## 2021-07-26 ENCOUNTER — Telehealth: Payer: Self-pay | Admitting: *Deleted

## 2021-07-26 NOTE — Telephone Encounter (Signed)
Patient informed. 

## 2021-07-26 NOTE — Telephone Encounter (Signed)
Please let the patient know that her ultrasound from last week was normal. Her IUD is in place. She had a 3 cm simple left ovarian cyst, this is a normal follicle and no follow up is needed. Cysts like this develop all the time in the ovaries and resolve with the menstrual cycle. Sometimes cyst can be tender. The mirena IUD can increase the chance of forming painful cysts. She is supposed to be calendaring her pain to see if there is a pattern to it. F/U as needed. We can always change the type of contraception she is using.

## 2021-07-26 NOTE — Telephone Encounter (Signed)
Patient had pelvic ultrasound last week 07/20/21 called would like to know the results. Please advise

## 2021-08-05 ENCOUNTER — Other Ambulatory Visit: Payer: BC Managed Care – PPO

## 2021-09-23 DIAGNOSIS — M65831 Other synovitis and tenosynovitis, right forearm: Secondary | ICD-10-CM | POA: Insufficient documentation

## 2021-09-23 DIAGNOSIS — M1811 Unilateral primary osteoarthritis of first carpometacarpal joint, right hand: Secondary | ICD-10-CM | POA: Insufficient documentation

## 2021-10-04 ENCOUNTER — Other Ambulatory Visit: Payer: Self-pay

## 2021-10-04 ENCOUNTER — Ambulatory Visit: Payer: BC Managed Care – PPO | Admitting: Obstetrics and Gynecology

## 2021-10-04 ENCOUNTER — Encounter: Payer: Self-pay | Admitting: Obstetrics and Gynecology

## 2021-10-04 VITALS — BP 138/86 | HR 72 | Resp 12 | Ht 64.0 in

## 2021-10-04 DIAGNOSIS — Z3009 Encounter for other general counseling and advice on contraception: Secondary | ICD-10-CM

## 2021-10-04 DIAGNOSIS — Z30432 Encounter for removal of intrauterine contraceptive device: Secondary | ICD-10-CM | POA: Diagnosis not present

## 2021-10-04 DIAGNOSIS — R102 Pelvic and perineal pain: Secondary | ICD-10-CM

## 2021-10-04 MED ORDER — NORETHINDRONE 0.35 MG PO TABS: 1.0000 | ORAL_TABLET | Freq: Every day | ORAL | 0 refills | Status: DC

## 2021-10-04 NOTE — Progress Notes (Signed)
Iud removalGYNECOLOGY  VISIT   HPI: 46 y.o.   Married White or Caucasian Not Hispanic or Latino  female   G0P0000 with No LMP recorded. (Menstrual status: IUD).   here for intermittent cramping and bleeding with IUD  She had a mirena IUD inserted in 3/22. She spotted for ~6 months after insertion. She was seen in 11/22 c/o intermittent pelvic cramping.  She had a normal exam. An ultrasound was done on 07/20/21 which showed a normal uterus, IUD in place, a 3 cm simple left ovarian cyst and a normal right ovary.   Since her last visit she reports continued random pelvic cramping, occurs about 1/2 of the month. Helped with ibuprofen, up to a 6/10 in severity. Her pain isn't getting worse, but it's annoying.  Occasional spotting to light bleeding.  Sexually active, no pain.   She is a smoker.   GYNECOLOGIC HISTORY: No LMP recorded. (Menstrual status: IUD). Contraception:IUD Menopausal hormone therapy: none        OB History     Gravida  0   Para  0   Term  0   Preterm  0   AB  0   Living  0      SAB  0   IAB  0   Ectopic  0   Multiple  0   Live Births  0              Patient Active Problem List   Diagnosis Date Noted   History of anaphylaxis 01/10/2018   Late menses 07/04/2016   Abnormal iron saturation 10/19/2015   Vegan diet 09/21/2015   Acute bronchitis 09/11/2015   Hypertension due to endocrine disorder 09/11/2015   Nephrolithiasis 09/11/2015   Metatarsal stress fracture of left foot 06/08/2015   Atypical squamous cell changes of undetermined significance (ASCUS) on cervical cytology with positive high risk human papilloma virus (HPV) 01/20/2015   Atypical squamous cells of undetermined significance on cytologic smear of cervix (ASC-US) 01/20/2015   Hyperaldosteronism (HCC) 10/29/2012   Essential hypertension 12/01/2011   Eating disorder 09/22/2011   Eczema 09/22/2011   Moderate episode of recurrent major depressive disorder (HCC) 09/22/2011    Allergic rhinitis, unspecified 09/22/2011    Past Medical History:  Diagnosis Date   Anxiety    Depression    Dysmenorrhea    Hypertension    Kidney stone     History reviewed. No pertinent surgical history.  Current Outpatient Medications  Medication Sig Dispense Refill   Ascorbic Acid (VITAMIN C) 100 MG tablet Take 100 mg by mouth daily.     b complex vitamins tablet Take 1 tablet by mouth daily.     BIOTIN PO Take 1 tablet by mouth daily.     diphenhydrAMINE (BENADRYL) 25 MG tablet Take 25 mg by mouth every 6 (six) hours as needed for allergies.     escitalopram (LEXAPRO) 20 MG tablet Take 20 mg by mouth daily.  3   Hypromellose (ARTIFICIAL TEARS OP) Place 1 drop into both eyes daily as needed (dry eyes).     ibuprofen (ADVIL,MOTRIN) 200 MG tablet Take 200 mg by mouth every 6 (six) hours as needed for moderate pain.     Multiple Vitamin (MULTIVITAMIN) tablet Take 1 tablet by mouth daily.     traZODone (DESYREL) 50 MG tablet TAKE 1-2 TABLETS (50-100 MG TOTAL) BY MOUTH NIGHTLY AS NEEDED  FOR SLEEP.     VITAMIN A PO Take 1 tablet by mouth daily.  No current facility-administered medications for this visit.     ALLERGIES: Patient has no known allergies.  Family History  Problem Relation Age of Onset   Breast cancer Neg Hx     Social History   Socioeconomic History   Marital status: Married    Spouse name: Not on file   Number of children: Not on file   Years of education: Not on file   Highest education level: Not on file  Occupational History   Not on file  Tobacco Use   Smoking status: Former    Types: Cigarettes    Quit date: 07/31/2014    Years since quitting: 7.1   Smokeless tobacco: Never  Vaping Use   Vaping Use: Never used  Substance and Sexual Activity   Alcohol use: Yes    Comment: 2 per day   Drug use: No   Sexual activity: Yes    Birth control/protection: None  Other Topics Concern   Not on file  Social History Narrative   Not on file    Social Determinants of Health   Financial Resource Strain: Not on file  Food Insecurity: Not on file  Transportation Needs: Not on file  Physical Activity: Not on file  Stress: Not on file  Social Connections: Not on file  Intimate Partner Violence: Not on file    Review of Systems  Genitourinary:        Spotting and cramping with IUD   All other systems reviewed and are negative.  PHYSICAL EXAMINATION:    BP 138/86 (BP Location: Left Arm, Patient Position: Sitting, Cuff Size: Normal)    Pulse 72    Resp 12    Ht 5\' 4"  (1.626 m)    BMI 21.46 kg/m     General appearance: alert, cooperative and appears stated age  Pelvic: External genitalia:  no lesions              Urethra:  normal appearing urethra with no masses, tenderness or lesions              Bartholins and Skenes: normal                 Vagina: normal appearing vagina with normal color and discharge, no lesions              Cervix: no lesions and IUD string 3 cm, IUD removed with ringed forceps               Chaperone was present for exam.  1. Pelvic cramping Continued pelvic cramping, started with IUD insertion in 3/22. Prior normal exam and normal ultrasound. -Discussed option of removing the IUD and trying another form of contraception.  2. General counseling and advice on female contraception Not a candidate for OCP's (smoker), reviewed vasectomy, micronor, depoprovera. She would like to try the mini-pill - norethindrone (ORTHO MICRONOR) 0.35 MG tablet; Take 1 tablet (0.35 mg total) by mouth daily.  Dispense: 84 tablet; Refill: 0. Will use back up contraception for one week.  3. Encounter for IUD removal IUD removed.

## 2021-11-29 NOTE — Progress Notes (Signed)
46 y.o. G0P0000 Married White or Caucasian Not Hispanic or Latino female here for annual exam.  She had a mirena IUD, it was removed in 2/23 secondary to cramping (in place on u/s). She was given a script for POP, never started it.  ?Sexually active, no pain. Using W/D for contraception. She wants to start the minipill.  ?Period Cycle (Days): 28 ?Period Duration (Days): 4 ?Period Pattern: Regular ?Menstrual Flow: Moderate ?Menstrual Control: Tampon ?Menstrual Control Change Freq (Hours): 4 ?Dysmenorrhea: (!) Mild ?Dysmenorrhea Symptoms: Cramping ? ?No bowel or bladder c/o.  ? ?She is having frequent headaches. Getting a little better. Negative CT scan, she will see a Neurologist. No auras.  ? ?Patient's last menstrual period was 11/26/2021.          ?Sexually active: Yes.    ?The current method of family planning is w/d   ?Exercising: Yes.     Biking  ?Smoker:  yes 1/4 pack a day. She bought nicorette gum, considering quiting.  ? ?Health Maintenance: ?Pap: 10/07/20 WNL Hr HPV Neg, 2020 normal  ?History of abnormal Pap:  yes ?MMG:  05/18/21 Density D Bi-rads 1 neg  ?BMD:   none  ?Colonoscopy: none ?TDaP:  05/25/21  ?Gardasil: n/a ? ? reports that she quit smoking about 7 years ago. Her smoking use included cigarettes. She has never used smokeless tobacco. She reports current alcohol use. She reports that she does not use drugs. 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  ? ?Past Medical History:  ?Diagnosis Date  ? Anxiety   ? Depression   ? Dysmenorrhea   ? Hypertension   ? Kidney stone   ? ? ?No past surgical history on file. ? ?Current Outpatient Medications  ?Medication Sig Dispense Refill  ? Ascorbic Acid (VITAMIN C) 100 MG tablet Take 100 mg by mouth daily.    ? b complex vitamins tablet Take 1 tablet by mouth daily.    ? BIOTIN PO Take 1 tablet by mouth daily.    ? diphenhydrAMINE (BENADRYL) 25 MG tablet Take 25 mg by mouth every 6 (six) hours as needed for  allergies.    ? escitalopram (LEXAPRO) 20 MG tablet Take 20 mg by mouth daily.  3  ? ibuprofen (ADVIL,MOTRIN) 200 MG tablet Take 200 mg by mouth every 6 (six) hours as needed for moderate pain.    ? Multiple Vitamin (MULTIVITAMIN) tablet Take 1 tablet by mouth daily.    ? norethindrone (ORTHO MICRONOR) 0.35 MG tablet Take 1 tablet (0.35 mg total) by mouth daily. 84 tablet 0  ? traZODone (DESYREL) 50 MG tablet TAKE 1-2 TABLETS (50-100 MG TOTAL) BY MOUTH NIGHTLY AS NEEDED  FOR SLEEP.    ? VITAMIN A PO Take 1 tablet by mouth daily.    ? ?No current facility-administered medications for this visit.  ? ? ?Family History  ?Problem Relation Age of Onset  ? Breast cancer Neg Hx   ? ? ?Review of Systems  ?All other systems reviewed and are negative. ? ?Exam:   ?BP 110/62   Pulse 62   Ht 5' 3.5" (1.613 m)   LMP 11/26/2021   SpO2 100%   BMI 21.80 kg/m?   Weight change: @WEIGHTCHANGE @ Height:   Height: 5' 3.5" (161.3 cm)  ?Ht Readings from Last 3 Encounters:  ?12/02/21 5' 3.5" (1.613 m)  ?10/04/21 5\' 4"  (1.626 m)  ?12/07/20 5\' 4"  (1.626 m)  ? ? ?General appearance: alert, cooperative and appears  stated age ?Head: Normocephalic, without obvious abnormality, atraumatic ?Neck: no adenopathy, supple, symmetrical, trachea midline and thyroid normal to inspection and palpation ?Lungs: clear to auscultation bilaterally ?Cardiovascular: regular rate and rhythm ?Breasts: normal appearance, no masses or tenderness ?Abdomen: soft, non-tender; non distended,  no masses,  no organomegaly ?Extremities: extremities normal, atraumatic, no cyanosis or edema ?Skin: Skin color, texture, turgor normal. No rashes or lesions ?Lymph nodes: Cervical, supraclavicular, and axillary nodes normal. ?No abnormal inguinal nodes palpated ?Neurologic: Grossly normal ? ? ?Pelvic: External genitalia:  no lesions ?             Urethra:  normal appearing urethra with no masses, tenderness or lesions ?             Bartholins and Skenes: normal    ?              Vagina: normal appearing vagina with normal color and discharge, no lesions ?             Cervix: no lesions ?              ?Bimanual Exam:  Uterus:  normal size, contour, position, consistency, mobility, non-tender ?             Adnexa: no mass, fullness, tenderness ?              Rectovaginal: Confirms ?              Anus:  normal sphincter tone, no lesions ? ?Gae Dry chaperoned for the exam. ? ?1. Well woman exam ?Discussed breast self exam ?Discussed calcium and vit D intake ?Mammogram UTD ?No pap this year ?Labs with primary ?She is trying to quit smoking ? ?2. Colon cancer screening ?Discussed options ?- Cologuard ? ?3. General counseling and advice on female contraception ?She will try the micronor ?- norethindrone (ORTHO MICRONOR) 0.35 MG tablet; Take 1 tablet (0.35 mg total) by mouth daily.  Dispense: 84 tablet; Refill: 3 ? ?    ? ? ? ?

## 2021-12-02 ENCOUNTER — Encounter: Payer: Self-pay | Admitting: Obstetrics and Gynecology

## 2021-12-02 ENCOUNTER — Ambulatory Visit (INDEPENDENT_AMBULATORY_CARE_PROVIDER_SITE_OTHER): Payer: BC Managed Care – PPO | Admitting: Obstetrics and Gynecology

## 2021-12-02 VITALS — BP 110/62 | HR 62 | Ht 63.5 in

## 2021-12-02 DIAGNOSIS — Z1211 Encounter for screening for malignant neoplasm of colon: Secondary | ICD-10-CM

## 2021-12-02 DIAGNOSIS — Z01419 Encounter for gynecological examination (general) (routine) without abnormal findings: Secondary | ICD-10-CM

## 2021-12-02 DIAGNOSIS — Z3009 Encounter for other general counseling and advice on contraception: Secondary | ICD-10-CM | POA: Diagnosis not present

## 2021-12-02 MED ORDER — NORETHINDRONE 0.35 MG PO TABS: 1.0000 | ORAL_TABLET | Freq: Every day | ORAL | 3 refills | Status: DC

## 2021-12-02 NOTE — Patient Instructions (Signed)

## 2022-04-28 ENCOUNTER — Other Ambulatory Visit (HOSPITAL_BASED_OUTPATIENT_CLINIC_OR_DEPARTMENT_OTHER): Payer: Self-pay | Admitting: Family Medicine

## 2022-04-28 DIAGNOSIS — Z1231 Encounter for screening mammogram for malignant neoplasm of breast: Secondary | ICD-10-CM

## 2022-05-11 ENCOUNTER — Other Ambulatory Visit (HOSPITAL_BASED_OUTPATIENT_CLINIC_OR_DEPARTMENT_OTHER): Payer: Self-pay | Admitting: Family Medicine

## 2022-05-11 DIAGNOSIS — R233 Spontaneous ecchymoses: Secondary | ICD-10-CM

## 2022-05-11 DIAGNOSIS — R1032 Left lower quadrant pain: Secondary | ICD-10-CM

## 2022-05-11 DIAGNOSIS — R634 Abnormal weight loss: Secondary | ICD-10-CM

## 2022-05-15 ENCOUNTER — Ambulatory Visit (HOSPITAL_BASED_OUTPATIENT_CLINIC_OR_DEPARTMENT_OTHER)
Admission: RE | Admit: 2022-05-15 | Discharge: 2022-05-15 | Disposition: A | Discharge status: Home / Self Care | Payer: BC Managed Care – PPO | Source: Ambulatory Visit | Attending: Family Medicine | Admitting: Family Medicine

## 2022-05-15 DIAGNOSIS — R233 Spontaneous ecchymoses: Secondary | ICD-10-CM | POA: Diagnosis present

## 2022-05-15 DIAGNOSIS — R1032 Left lower quadrant pain: Secondary | ICD-10-CM | POA: Diagnosis not present

## 2022-05-15 DIAGNOSIS — R634 Abnormal weight loss: Secondary | ICD-10-CM | POA: Diagnosis present

## 2022-05-15 MED ORDER — IOHEXOL 300 MG/ML  SOLN
100.0000 mL | Freq: Once | INTRAMUSCULAR | Status: AC | PRN
  Administered 2022-05-15: 80 mL via INTRAVENOUS

## 2022-05-19 LAB — COLOGUARD: COLOGUARD: NEGATIVE

## 2022-06-02 ENCOUNTER — Ambulatory Visit (HOSPITAL_BASED_OUTPATIENT_CLINIC_OR_DEPARTMENT_OTHER): Payer: BC Managed Care – PPO | Admitting: Radiology

## 2022-06-05 ENCOUNTER — Ambulatory Visit (HOSPITAL_BASED_OUTPATIENT_CLINIC_OR_DEPARTMENT_OTHER): Payer: BC Managed Care – PPO | Admitting: Radiology

## 2022-06-07 ENCOUNTER — Other Ambulatory Visit (HOSPITAL_BASED_OUTPATIENT_CLINIC_OR_DEPARTMENT_OTHER): Payer: Self-pay | Admitting: Family Medicine

## 2022-06-07 DIAGNOSIS — M79641 Pain in right hand: Secondary | ICD-10-CM

## 2022-06-08 ENCOUNTER — Ambulatory Visit (HOSPITAL_BASED_OUTPATIENT_CLINIC_OR_DEPARTMENT_OTHER)
Admission: RE | Admit: 2022-06-08 | Discharge: 2022-06-08 | Disposition: A | Discharge status: Home / Self Care | Payer: BC Managed Care – PPO | Source: Ambulatory Visit | Attending: Family Medicine | Admitting: Family Medicine

## 2022-06-08 DIAGNOSIS — M79641 Pain in right hand: Secondary | ICD-10-CM | POA: Diagnosis present

## 2022-06-12 ENCOUNTER — Ambulatory Visit (HOSPITAL_BASED_OUTPATIENT_CLINIC_OR_DEPARTMENT_OTHER)
Admission: RE | Admit: 2022-06-12 | Discharge: 2022-06-12 | Disposition: A | Discharge status: Home / Self Care | Payer: BC Managed Care – PPO | Source: Ambulatory Visit | Attending: Family Medicine | Admitting: Family Medicine

## 2022-06-12 DIAGNOSIS — Z1231 Encounter for screening mammogram for malignant neoplasm of breast: Secondary | ICD-10-CM | POA: Insufficient documentation

## 2022-07-01 ENCOUNTER — Other Ambulatory Visit (HOSPITAL_BASED_OUTPATIENT_CLINIC_OR_DEPARTMENT_OTHER): Payer: Self-pay | Admitting: Family Medicine

## 2022-07-01 DIAGNOSIS — G8929 Other chronic pain: Secondary | ICD-10-CM

## 2022-07-04 ENCOUNTER — Encounter (HOSPITAL_BASED_OUTPATIENT_CLINIC_OR_DEPARTMENT_OTHER): Payer: Self-pay | Admitting: Family Medicine

## 2022-07-06 ENCOUNTER — Ambulatory Visit (INDEPENDENT_AMBULATORY_CARE_PROVIDER_SITE_OTHER): Payer: BC Managed Care – PPO | Admitting: Orthopaedic Surgery

## 2022-07-06 DIAGNOSIS — M65311 Trigger thumb, right thumb: Secondary | ICD-10-CM | POA: Diagnosis not present

## 2022-07-06 DIAGNOSIS — M79641 Pain in right hand: Secondary | ICD-10-CM

## 2022-07-06 NOTE — Progress Notes (Addendum)
Chief Complaint: Right thumb pain     History of Present Illness:    Brooke Aguilar is a 46 y.o. female right-hand-dominant female presents with right thumb pain which has been ongoing for 2 years now.  She states that she experiences pain with opening jars.  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  She uses an over-the-counter thumb spica brace particularly during photography.  Grip strength is become quite bothersome for her.  In the past she has had 3 injections into the thumb although that is unclear where these were placed.  This did give her relief.  She recently underwent shockwave therapy which also gave her some relief.    Surgical History:   None  PMH/PSH/Family History/Social History/Meds/Allergies:    Past Medical History:  Diagnosis Date  . Anxiety   . Depression   . Dysmenorrhea   . Hypertension   . Kidney stone    No past surgical history on file. Social History   Socioeconomic History  . Marital status: Married    Spouse name: Not on file  . Number of children: Not on file  . Years of education: Not on file  . Highest education level: Not on file  Occupational History  . Not on file  Tobacco Use  . Smoking status: Former    Types: Cigarettes    Quit date: 07/31/2014    Years since quitting: 7.9  . Smokeless tobacco: Never  Vaping Use  . Vaping Use: Never used  Substance and Sexual Activity  . Alcohol use: Yes    Comment: 2 per day  . Drug use: No  . Sexual activity: Yes    Birth control/protection: None  Other Topics Concern  . Not on file  Social History Narrative  . Not on file   Social Determinants of Health   Financial Resource Strain: Not on file  Food Insecurity: Not on file  Transportation Needs: Not on file  Physical Activity: Not on file  Stress: Not on file  Social Connections: Not on file   Family History  Problem Relation Age of Onset  . Breast cancer Neg Hx    No Known  Allergies Current Outpatient Medications  Medication Sig Dispense Refill  . Ascorbic Acid (VITAMIN C) 100 MG tablet Take 100 mg by mouth daily.    Marland Kitchen b complex vitamins tablet Take 1 tablet by mouth daily.    Marland Kitchen BIOTIN PO Take 1 tablet by mouth daily.    . diphenhydrAMINE (BENADRYL) 25 MG tablet Take 25 mg by mouth every 6 (six) hours as needed for allergies.    Marland Kitchen escitalopram (LEXAPRO) 20 MG tablet Take 20 mg by mouth daily.  3  . ibuprofen (ADVIL,MOTRIN) 200 MG tablet Take 200 mg by mouth every 6 (six) hours as needed for moderate pain.    . Multiple Vitamin (MULTIVITAMIN) tablet Take 1 tablet by mouth daily.    . norethindrone (ORTHO MICRONOR) 0.35 MG tablet Take 1 tablet (0.35 mg total) by mouth daily. 84 tablet 3  . traZODone (DESYREL) 50 MG tablet TAKE 1-2 TABLETS (50-100 MG TOTAL) BY MOUTH NIGHTLY AS NEEDED  FOR SLEEP.    Marland Kitchen VITAMIN A PO Take 1 tablet by mouth daily.     No current facility-administered medications for this visit.   No results  found.  Review of Systems:   A ROS was performed including pertinent positives and negatives as documented in the HPI.  Physical Exam :   Constitutional: NAD and appears stated age Neurological: Alert and oriented Psych: Appropriate affect and cooperative There were no vitals taken for this visit.   Comprehensive Musculoskeletal Exam:    Tenderness palpation about the A1 flexor tendon of the right thumb.  Decreased strength with grip and increased pain.  Remainder of neurosensory exam is intact.  Imaging:   Xray (3 views right hand): Normal    I personally reviewed and interpreted the radiographs.   Assessment:   46 y.o. female with right appears to be an early flexor tendon irritation of the right thumb.  She does not have any frank triggering.  To that effect I recommended ultrasound-guided injection of the right thumb.  She would like to proceed with this today.  I have advised that I would still like her to get her MRI and that  this would help to better advise her.  I will plan to send her a message once we have the MRI with further clarification.  Plan :    -We will plan to call her back following MRI right thumb    Procedure Note  Patient: Brooke Aguilar             Date of Birth: [birth year]           MRN: [id number]             Visit Date: 07/06/2022  Procedures: Visit Diagnoses:  1. Trigger finger of right thumb     Small Joint Inj on 07/06/2022 9:39 AM       I personally saw and evaluated the patient, and participated in the management and treatment plan.  Huel Cote, MD Attending Physician, Orthopedic Surgery  This document was dictated using Dragon voice recognition software. A reasonable attempt at proof reading has been made to minimize errors.

## 2022-07-14 ENCOUNTER — Ambulatory Visit (HOSPITAL_BASED_OUTPATIENT_CLINIC_OR_DEPARTMENT_OTHER): Payer: BC Managed Care – PPO

## 2022-08-29 ENCOUNTER — Encounter (HOSPITAL_BASED_OUTPATIENT_CLINIC_OR_DEPARTMENT_OTHER): Payer: Self-pay | Admitting: Orthopaedic Surgery

## 2022-08-30 ENCOUNTER — Other Ambulatory Visit (HOSPITAL_BASED_OUTPATIENT_CLINIC_OR_DEPARTMENT_OTHER): Payer: Self-pay | Admitting: Orthopaedic Surgery

## 2022-08-30 DIAGNOSIS — M65311 Trigger thumb, right thumb: Secondary | ICD-10-CM

## 2022-09-08 ENCOUNTER — Ambulatory Visit (HOSPITAL_BASED_OUTPATIENT_CLINIC_OR_DEPARTMENT_OTHER): Payer: BC Managed Care – PPO

## 2022-09-22 ENCOUNTER — Other Ambulatory Visit (HOSPITAL_BASED_OUTPATIENT_CLINIC_OR_DEPARTMENT_OTHER): Payer: Self-pay | Admitting: Orthopaedic Surgery

## 2022-09-22 DIAGNOSIS — M65311 Trigger thumb, right thumb: Secondary | ICD-10-CM

## 2022-11-02 ENCOUNTER — Encounter (HOSPITAL_BASED_OUTPATIENT_CLINIC_OR_DEPARTMENT_OTHER): Payer: Self-pay | Admitting: Orthopaedic Surgery

## 2022-11-02 ENCOUNTER — Other Ambulatory Visit (HOSPITAL_BASED_OUTPATIENT_CLINIC_OR_DEPARTMENT_OTHER): Payer: Self-pay | Admitting: Orthopaedic Surgery

## 2022-11-02 DIAGNOSIS — M65311 Trigger thumb, right thumb: Secondary | ICD-10-CM

## 2022-11-04 ENCOUNTER — Encounter: Payer: Self-pay | Admitting: Obstetrics and Gynecology

## 2022-11-04 DIAGNOSIS — Z3009 Encounter for other general counseling and advice on contraception: Secondary | ICD-10-CM

## 2022-11-08 NOTE — Addendum Note (Signed)
Addended by: Dorothy Spark on: 11/08/2022 04:50 PM   Modules accepted: Orders

## 2022-11-09 NOTE — Telephone Encounter (Signed)
Can you please ask someone to check her benefits for the IUD, I've placed the order. She needs an appointment for IUD insertion as well. Thanks!

## 2022-11-22 ENCOUNTER — Encounter: Payer: Self-pay | Admitting: *Deleted

## 2022-12-07 ENCOUNTER — Ambulatory Visit: Payer: BC Managed Care – PPO | Admitting: Obstetrics and Gynecology

## 2022-12-08 ENCOUNTER — Encounter: Payer: Self-pay | Admitting: Obstetrics and Gynecology

## 2022-12-08 ENCOUNTER — Ambulatory Visit (INDEPENDENT_AMBULATORY_CARE_PROVIDER_SITE_OTHER): Payer: BC Managed Care – PPO | Admitting: Obstetrics and Gynecology

## 2022-12-08 VITALS — BP 118/64 | HR 75

## 2022-12-08 DIAGNOSIS — Z3009 Encounter for other general counseling and advice on contraception: Secondary | ICD-10-CM

## 2022-12-08 DIAGNOSIS — Z01812 Encounter for preprocedural laboratory examination: Secondary | ICD-10-CM

## 2022-12-08 DIAGNOSIS — Z30430 Encounter for insertion of intrauterine contraceptive device: Secondary | ICD-10-CM | POA: Diagnosis not present

## 2022-12-08 LAB — PREGNANCY, URINE: Preg Test, Ur: NEGATIVE

## 2022-12-08 MED ORDER — LEVONORGESTREL 20 MCG/DAY IU IUD
1.0000 | INTRAUTERINE_SYSTEM | Freq: Once | INTRAUTERINE | Status: AC
  Administered 2022-12-08: 1 via INTRAUTERINE

## 2022-12-08 NOTE — Patient Instructions (Signed)

## 2022-12-08 NOTE — Addendum Note (Signed)
Addended by: Blima Ledger on: 12/08/2022 11:45 AM   Modules accepted: Orders

## 2022-12-08 NOTE — Progress Notes (Signed)
GYNECOLOGY  VISIT   HPI: 47 y.o.   Married White or Caucasian Not Hispanic or Latino  female   G0P0000 with No LMP recorded.   here for mirena IUD insertion.  Cycles are q 3-4 weeks x 4-7 days. Normal flow. Bad cramps for one day. No BTB.  GYNECOLOGIC HISTORY: No LMP recorded. Contraception:none no unprotected sex in the last 2 weeks.  Menopausal hormone therapy: none         OB History     Gravida  0   Para  0   Term  0   Preterm  0   AB  0   Living  0      SAB  0   IAB  0   Ectopic  0   Multiple  0   Live Births  0              Patient Active Problem List   Diagnosis Date Noted   Arthritis of carpometacarpal (CMC) joint of right thumb 09/23/2021   Extensor intersection syndrome of right wrist 09/23/2021   Trigger finger of right thumb 07/20/2021   History of anaphylaxis 01/10/2018   Late menses 07/04/2016   Abnormal iron saturation 10/19/2015   Vegan diet 09/21/2015   Acute bronchitis 09/11/2015   Hypertension due to endocrine disorder 09/11/2015   Nephrolithiasis 09/11/2015   Metatarsal stress fracture of left foot 06/08/2015   Atypical squamous cell changes of undetermined significance (ASCUS) on cervical cytology with positive high risk human papilloma virus (HPV) 01/20/2015   Atypical squamous cells of undetermined significance on cytologic smear of cervix (ASC-US) 01/20/2015   Hyperaldosteronism 10/29/2012   Essential hypertension 12/01/2011   Eating disorder 09/22/2011   Eczema 09/22/2011   Moderate episode of recurrent major depressive disorder 09/22/2011   Allergic rhinitis, unspecified 09/22/2011    Past Medical History:  Diagnosis Date   Anxiety    Depression    Dysmenorrhea    Hypertension    Kidney stone     No past surgical history on file.  Current Outpatient Medications  Medication Sig Dispense Refill   Ascorbic Acid (VITAMIN C) 100 MG tablet Take 100 mg by mouth daily.     b complex vitamins tablet Take 1 tablet by  mouth daily.     BIOTIN PO Take 1 tablet by mouth daily.     diphenhydrAMINE (BENADRYL) 25 MG tablet Take 25 mg by mouth every 6 (six) hours as needed for allergies.     escitalopram (LEXAPRO) 20 MG tablet Take 20 mg by mouth daily.  3   ibuprofen (ADVIL,MOTRIN) 200 MG tablet Take 200 mg by mouth every 6 (six) hours as needed for moderate pain.     Multiple Vitamin (MULTIVITAMIN) tablet Take 1 tablet by mouth daily.     norethindrone (ORTHO MICRONOR) 0.35 MG tablet Take 1 tablet (0.35 mg total) by mouth daily. 84 tablet 3   traZODone (DESYREL) 50 MG tablet TAKE 1-2 TABLETS (50-100 MG TOTAL) BY MOUTH NIGHTLY AS NEEDED  FOR SLEEP.     VITAMIN A PO Take 1 tablet by mouth daily.     No current facility-administered medications for this visit.     ALLERGIES: Patient has no known allergies.  Family History  Problem Relation Age of Onset   Breast cancer Neg Hx     Social History   Socioeconomic History   Marital status: Married    Spouse name: Not on file   Number of children: Not on file  Years of education: Not on file   Highest education level: Not on file  Occupational History   Not on file  Tobacco Use   Smoking status: Former    Types: Cigarettes    Quit date: 07/31/2014    Years since quitting: 8.3   Smokeless tobacco: Never  Vaping Use   Vaping Use: Never used  Substance and Sexual Activity   Alcohol use: Yes    Comment: 2 per day   Drug use: No   Sexual activity: Yes    Birth control/protection: None  Other Topics Concern   Not on file  Social History Narrative   Not on file   Social Determinants of Health   Financial Resource Strain: Not on file  Food Insecurity: Not on file  Transportation Needs: Not on file  Physical Activity: Not on file  Stress: Not on file  Social Connections: Not on file  Intimate Partner Violence: Not on file    Review of Systems  All other systems reviewed and are negative.   PHYSICAL EXAMINATION:    There were no vitals  taken for this visit.    General appearance: alert, cooperative and appears stated age  Pelvic: External genitalia:  no lesions              Urethra:  normal appearing urethra with no masses, tenderness or lesions              Bartholins and Skenes: normal                 Vagina: normal appearing vagina with normal color and discharge, no lesions              Cervix: no lesions               The risks of the mirena IUD were reviewed with the patient, including infection, abnormal bleeding and uterine perfortion. Consent was signed.  A speculum was placed in the vagina, the cervix was cleansed with betadine. A tenaculum was placed on the cervix, the uterus sounded to ~7 cm. The cervix was dilated from a mini-dilator to a #5 hagar  The mirena IUD was inserted without difficulty. The string were cut to 3 cm.    The patient tolerated the procedure well.    Chaperone, Carolynn Serve, CMA was present for exam.

## 2022-12-11 ENCOUNTER — Encounter (HOSPITAL_BASED_OUTPATIENT_CLINIC_OR_DEPARTMENT_OTHER): Payer: Self-pay | Admitting: Orthopaedic Surgery

## 2022-12-14 NOTE — Progress Notes (Unsigned)
47 y.o. G0P0000 Married White or Caucasian Not Hispanic or Latino female here for annual exam.   She had a mirena IUD inserted last month. Prior to IUD inserion cycles were q 3-4 weeks x 4-7 days, normal flow, bad cramps for one day. One cycle since the IUD was inserted, spotting as well, no cramps.  No dyspareunia.    Patient's last menstrual period was 12/15/2022 (approximate).          Sexually active: Yes.    The current method of family planning is IUD.    Exercising: Yes.     Running and biking Smoker:  yes, 5 cig a day.  Health Maintenance: Pap:   10/07/20 WNL Hr HPV Neg; 2020 normal  History of abnormal Pap:  yes MMG:  06/14/22 density B Bi-rads 1 neg  BMD:   n/a Colonoscopy: none Cologuard 05/11/22 neg  TDaP:  05/25/21 Gardasil: n/a   reports that she quit smoking about 8 years ago. Her smoking use included cigarettes. She has never used smokeless tobacco. She reports current alcohol use. She reports that she does not use drugs. Just occasional ETOH.  Past Medical History:  Diagnosis Date   Anxiety    Depression    Dysmenorrhea    Hypertension    Kidney stone     History reviewed. No pertinent surgical history.  Current Outpatient Medications  Medication Sig Dispense Refill   Ascorbic Acid (VITAMIN C) 100 MG tablet Take 100 mg by mouth daily.     BIOTIN PO Take 1 tablet by mouth daily.     diphenhydrAMINE (BENADRYL) 25 MG tablet Take 25 mg by mouth every 6 (six) hours as needed for allergies.     escitalopram (LEXAPRO) 20 MG tablet Take 20 mg by mouth daily.  3   Multiple Vitamin (MULTIVITAMIN) tablet Take 1 tablet by mouth daily.     spironolactone (ALDACTONE) 25 MG tablet Take 25 mg by mouth 2 (two) times daily.     traZODone (DESYREL) 50 MG tablet TAKE 1-2 TABLETS (50-100 MG TOTAL) BY MOUTH NIGHTLY AS NEEDED  FOR SLEEP.     VITAMIN A PO Take 1 tablet by mouth daily.     No current facility-administered medications for this visit.    Family History  Problem  Relation Age of Onset   Breast cancer Neg Hx     Review of Systems  All other systems reviewed and are negative.   Exam:   BP 122/80 (BP Location: Right Arm, Patient Position: Sitting)   Pulse 87   Resp 18   LMP 12/15/2022 (Approximate) Comment: IUD  SpO2 97%   Weight change: @WEIGHTCHANGE @ Height:      Ht Readings from Last 3 Encounters:  12/02/21 5' 3.5" (1.613 m)  10/04/21 5\' 4"  (1.626 m)  12/07/20 5\' 4"  (1.626 m)    General appearance: alert, cooperative and appears stated age Head: Normocephalic, without obvious abnormality, atraumatic Neck: no adenopathy, supple, symmetrical, trachea midline and thyroid normal to inspection and palpation Lungs: clear to auscultation bilaterally Cardiovascular: regular rate and rhythm Breasts: normal appearance, no masses or tenderness Abdomen: soft, non-tender; non distended,  no masses,  no organomegaly Extremities: extremities normal, atraumatic, no cyanosis or edema Skin: Skin color, texture, turgor normal. No rashes or lesions Lymph nodes: Cervical, supraclavicular, and axillary nodes normal. No abnormal inguinal nodes palpated Neurologic: Grossly normal   Pelvic: External genitalia:  no lesions              Urethra:  normal  appearing urethra with no masses, tenderness or lesions              Bartholins and Skenes: normal                 Vagina: normal appearing vagina with normal color and discharge, no lesions              Cervix: no lesions and IUD strings 2-3 cm               Bimanual Exam:  Uterus:  normal size, contour, position, consistency, mobility, non-tender and anteverted              Adnexa: no mass, fullness, tenderness               Rectovaginal: Confirms               Anus:  normal sphincter tone, no lesions  Carmelina Dane, RN chaperoned for the exam.  1. Well woman exam Discussed breast self exam Discussed calcium and vit D intake Labs with primary No pap this year Mammogram and cologuard are UTD  2. IUD  check up Doing well, spotting should improve in the next few months

## 2022-12-16 ENCOUNTER — Ambulatory Visit (HOSPITAL_BASED_OUTPATIENT_CLINIC_OR_DEPARTMENT_OTHER): Payer: BC Managed Care – PPO | Admitting: Orthopaedic Surgery

## 2022-12-16 DIAGNOSIS — M1811 Unilateral primary osteoarthritis of first carpometacarpal joint, right hand: Secondary | ICD-10-CM

## 2022-12-16 DIAGNOSIS — M180 Bilateral primary osteoarthritis of first carpometacarpal joints: Secondary | ICD-10-CM | POA: Diagnosis not present

## 2022-12-16 DIAGNOSIS — M65311 Trigger thumb, right thumb: Secondary | ICD-10-CM

## 2022-12-16 DIAGNOSIS — M1812 Unilateral primary osteoarthritis of first carpometacarpal joint, left hand: Secondary | ICD-10-CM

## 2022-12-16 NOTE — Progress Notes (Signed)
Chief Complaint: Right thumb pain     History of Present Illness:   12/16/2022: Presents today for followup of her right thumb. She is here today for mri followup. She is still have persistent pain at the base of the right thumb.   Brooke Aguilar is a 47 y.o. female right-hand-dominant female presents with right thumb pain which has been ongoing for 2 years now.  She states that she experiences pain with opening jars.  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  She uses an over-the-counter thumb spica brace particularly during photography.  Grip strength is become quite bothersome for her.  In the past she has had 3 injections into the thumb although that is unclear where these were placed.  This did give her relief.  She recently underwent shockwave therapy which also gave her some relief.    Surgical History:   None  PMH/PSH/Family History/Social History/Meds/Allergies:    Past Medical History:  Diagnosis Date   Anxiety    Depression    Dysmenorrhea    Hypertension    Kidney stone    No past surgical history on file. Social History   Socioeconomic History   Marital status: Married    Spouse name: Not on file   Number of children: Not on file   Years of education: Not on file   Highest education level: Not on file  Occupational History   Not on file  Tobacco Use   Smoking status: Former    Types: Cigarettes    Quit date: 07/31/2014    Years since quitting: 8.3   Smokeless tobacco: Never  Vaping Use   Vaping Use: Never used  Substance and Sexual Activity   Alcohol use: Yes    Comment: 2 per day   Drug use: No   Sexual activity: Yes    Birth control/protection: None  Other Topics Concern   Not on file  Social History Narrative   Not on file   Social Determinants of Health   Financial Resource Strain: Not on file  Food Insecurity: Not on file  Transportation Needs: Not on file  Physical Activity: Not on file   Stress: Not on file  Social Connections: Not on file   Family History  Problem Relation Age of Onset   Breast cancer Neg Hx    No Known Allergies Current Outpatient Medications  Medication Sig Dispense Refill   Ascorbic Acid (VITAMIN C) 100 MG tablet Take 100 mg by mouth daily.     BIOTIN PO Take 1 tablet by mouth daily.     diphenhydrAMINE (BENADRYL) 25 MG tablet Take 25 mg by mouth every 6 (six) hours as needed for allergies.     escitalopram (LEXAPRO) 20 MG tablet Take 20 mg by mouth daily.  3   Multiple Vitamin (MULTIVITAMIN) tablet Take 1 tablet by mouth daily.     spironolactone (ALDACTONE) 25 MG tablet Take 25 mg by mouth 2 (two) times daily.     traZODone (DESYREL) 50 MG tablet TAKE 1-2 TABLETS (50-100 MG TOTAL) BY MOUTH NIGHTLY AS NEEDED  FOR SLEEP.     VITAMIN A PO Take 1 tablet by mouth daily.     No current facility-administered medications for this visit.   No results found.  Review of Systems:   A ROS  was performed including pertinent positives and negatives as documented in the HPI.  Physical Exam :   Constitutional: NAD and appears stated age Neurological: Alert and oriented Psych: Appropriate affect and cooperative Last menstrual period 11/28/2022.   Comprehensive Musculoskeletal Exam:    Tenderness palpation about the A1 flexor tendon of the right thumb.  Decreased strength with grip and increased pain.  Remainder of neurosensory exam is intact.  Imaging:   Xray (3 views right hand): Normal  MRI right thumb: Mild CMC thumb base osteoarthritis  I personally reviewed and interpreted the radiographs.   Assessment:   47 y.o. female with right thumb CMC arthritis.  Today's visit she has been persistently using a brace.  I did offer a CMC injection which she has elected for.  I will plan to see her back in late summer should this wear off and she want to consider further arthroplasty Plan :    -Thumb CMC injection performed after verbal consent  obtained    Procedure Note  Patient: Brooke Aguilar             Date of Birth: 1975-12-11           MRN: [id number]             Visit Date: 12/16/2022  Procedures: Visit Diagnoses:  No diagnosis found.   Small Joint Inj: R thumb CMC on 12/16/2022 11:46 AM       I personally saw and evaluated the patient, and participated in the management and treatment plan.  Huel Cote, MD Attending Physician, Orthopedic Surgery  This document was dictated using Dragon voice recognition software. A reasonable attempt at proof reading has been made to minimize errors.

## 2022-12-20 ENCOUNTER — Ambulatory Visit: Payer: BC Managed Care – PPO | Admitting: Obstetrics and Gynecology

## 2022-12-23 ENCOUNTER — Ambulatory Visit (HOSPITAL_BASED_OUTPATIENT_CLINIC_OR_DEPARTMENT_OTHER): Payer: BC Managed Care – PPO | Admitting: Orthopaedic Surgery

## 2022-12-28 ENCOUNTER — Ambulatory Visit (INDEPENDENT_AMBULATORY_CARE_PROVIDER_SITE_OTHER): Payer: BC Managed Care – PPO | Admitting: Obstetrics and Gynecology

## 2022-12-28 ENCOUNTER — Ambulatory Visit (HOSPITAL_BASED_OUTPATIENT_CLINIC_OR_DEPARTMENT_OTHER): Payer: BC Managed Care – PPO | Admitting: Orthopaedic Surgery

## 2022-12-28 ENCOUNTER — Encounter: Payer: Self-pay | Admitting: Obstetrics and Gynecology

## 2022-12-28 VITALS — BP 122/80 | HR 87 | Resp 18

## 2022-12-28 DIAGNOSIS — Z01419 Encounter for gynecological examination (general) (routine) without abnormal findings: Secondary | ICD-10-CM

## 2022-12-28 DIAGNOSIS — Z30431 Encounter for routine checking of intrauterine contraceptive device: Secondary | ICD-10-CM

## 2022-12-28 NOTE — Patient Instructions (Signed)

## 2023-01-31 IMAGING — MG MM DIGITAL SCREENING BILAT W/ TOMO AND CAD
8 series · 9 of 24 positions shown · non-contrast
Comparison: Previous exam(s).

CLINICAL DATA: Screening.

EXAM:
DIGITAL SCREENING BILATERAL MAMMOGRAM WITH TOMOSYNTHESIS AND CAD
TECHNIQUE: Bilateral screening digital craniocaudal and mediolateral oblique
mammograms were obtained. Bilateral screening digital breast
tomosynthesis was performed. The images were evaluated with
computer-aided detection.

[R CC synth-2D]
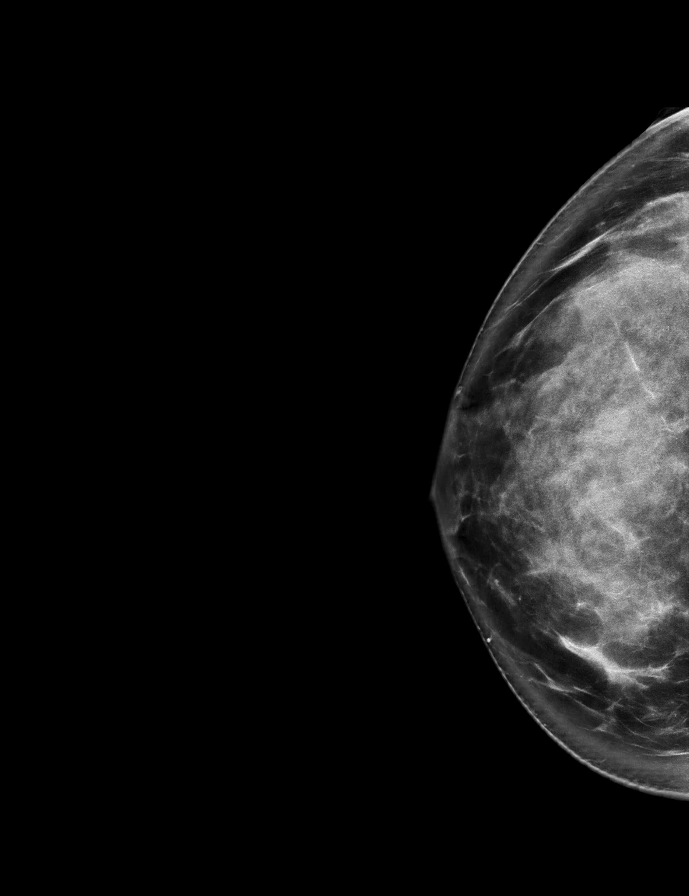

[L MLO synth-2D]
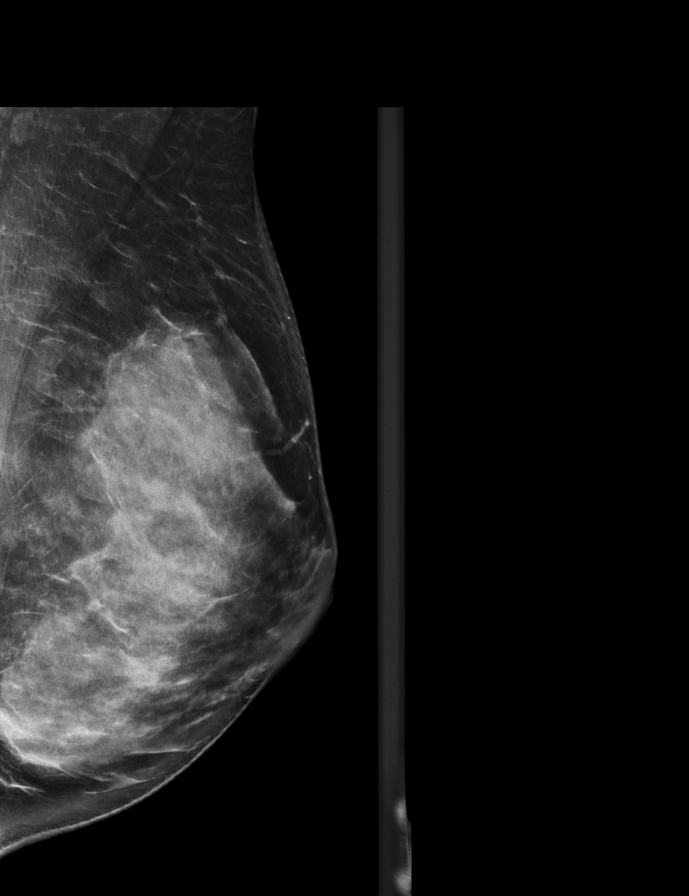

[R MLO synth-2D]
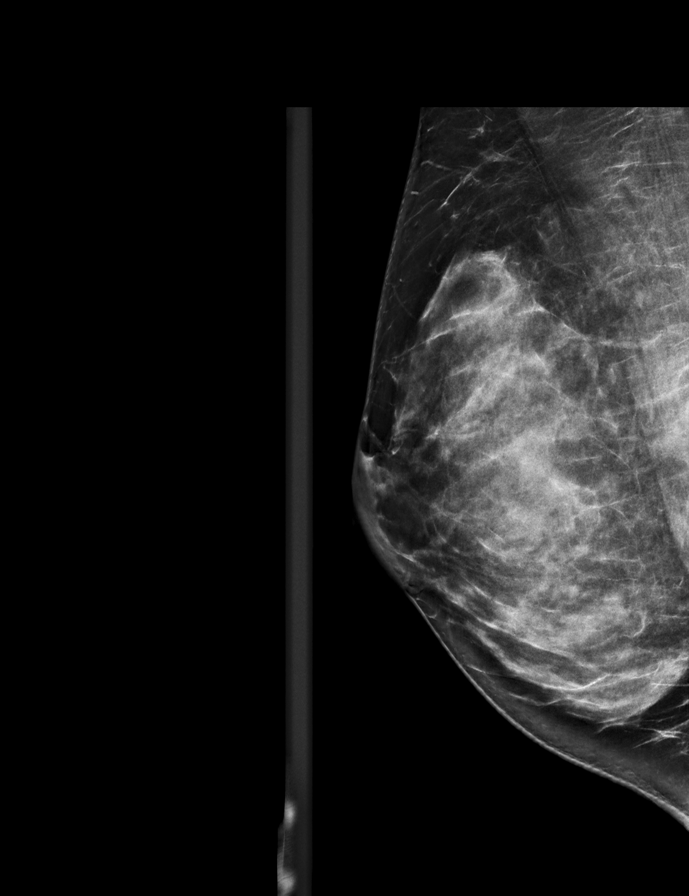

[L CC synth-2D]
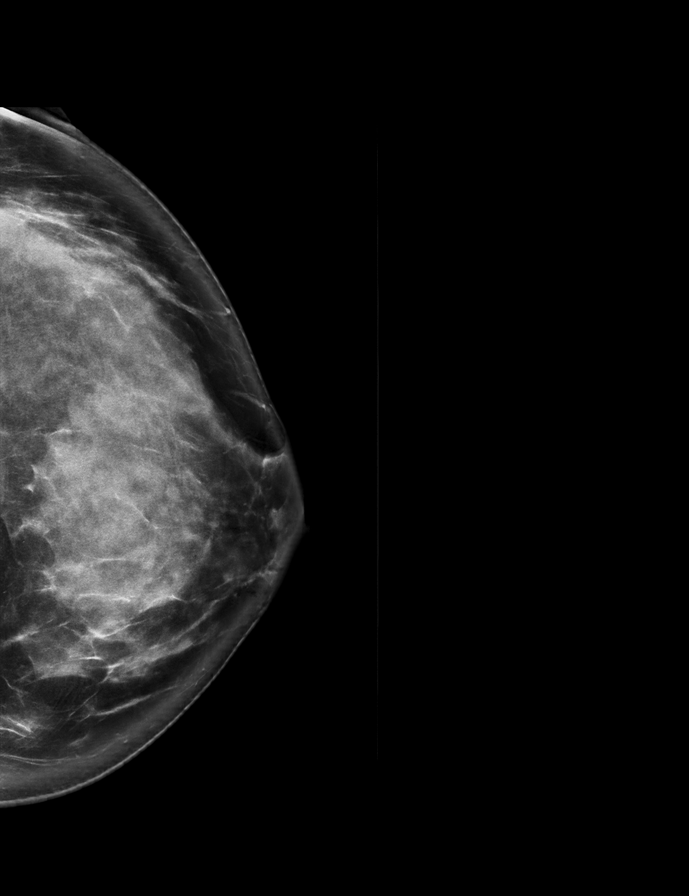

[L CC tomo · 2 of 82 frames shown]
[frame 27/82]
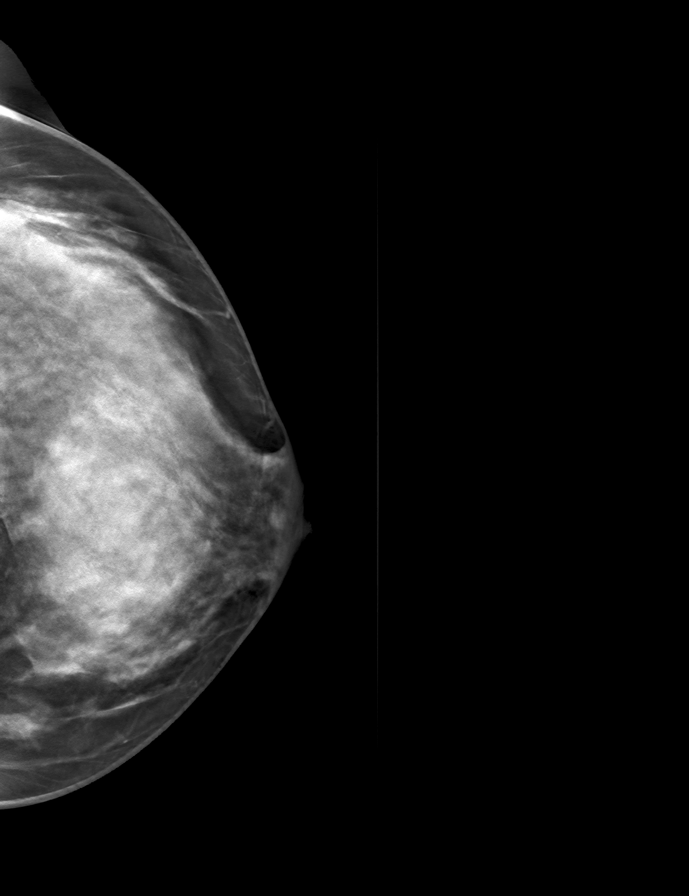
[frame 41/82]
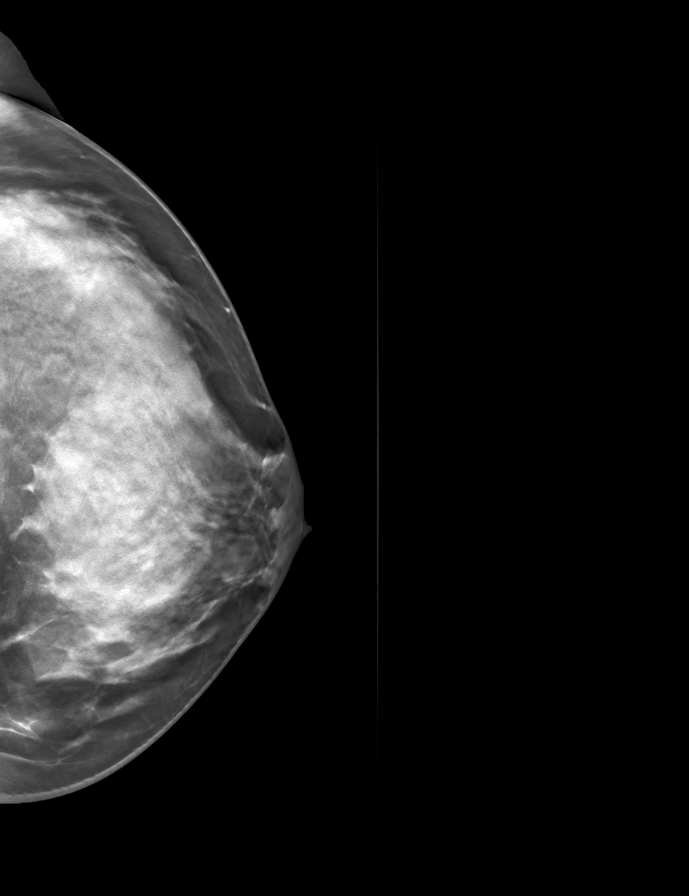

[L MLO tomo · tomo slice 40/79.0]
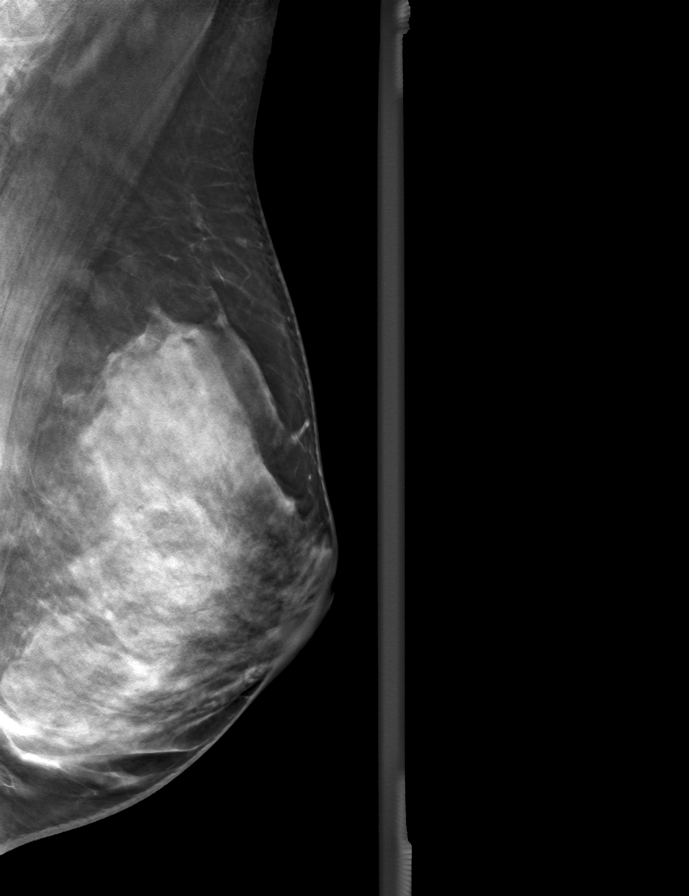

[R CC tomo · tomo slice 42/83.0]
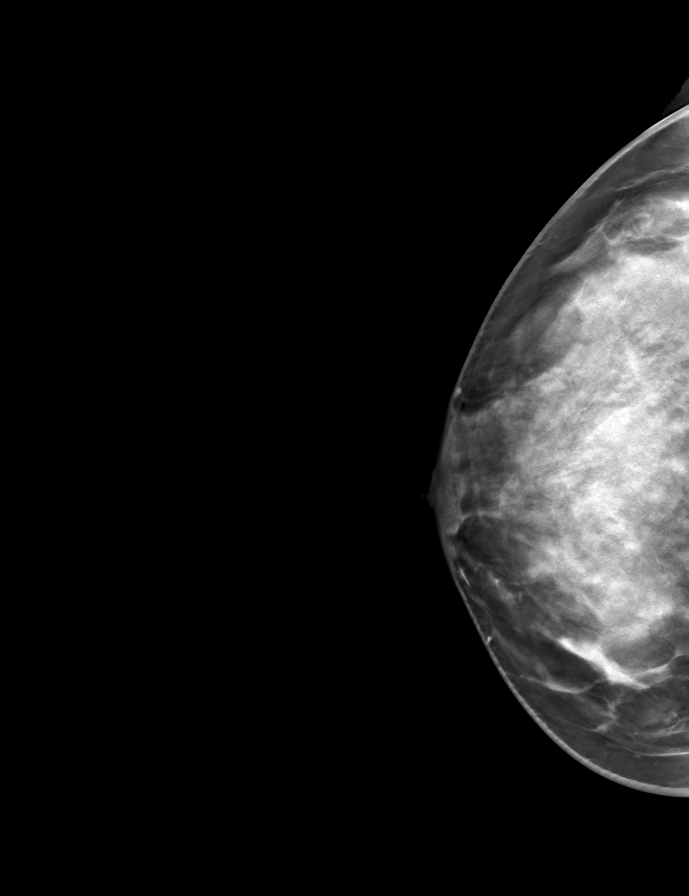

[R MLO tomo · tomo slice 38/75.0]
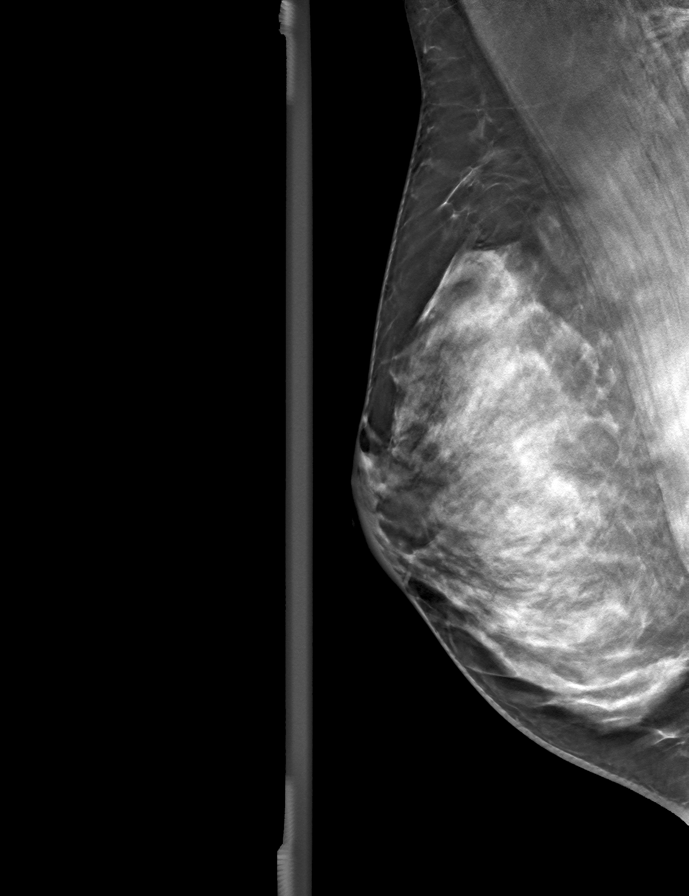

[9 of 24 positions shown; findings below may reference images not displayed]

ACR Breast Density Category d: The breast tissue is extremely dense,
which lowers the sensitivity of mammography
FINDINGS: There are no findings suspicious for malignancy.
IMPRESSION: No mammographic evidence of malignancy. A result letter of this
screening mammogram will be mailed directly to the patient.

RECOMMENDATION:
Screening mammogram in one year. (Code:[id number]-V-WV9)

BI-RADS CATEGORY  1: Negative.

## 2023-05-14 ENCOUNTER — Telehealth: Payer: BC Managed Care – PPO

## 2024-01-03 ENCOUNTER — Ambulatory Visit (INDEPENDENT_AMBULATORY_CARE_PROVIDER_SITE_OTHER): Admitting: Obstetrics and Gynecology

## 2024-01-03 ENCOUNTER — Encounter: Payer: Self-pay | Admitting: Obstetrics and Gynecology

## 2024-01-03 ENCOUNTER — Other Ambulatory Visit (HOSPITAL_COMMUNITY)
Admission: RE | Admit: 2024-01-03 | Discharge: 2024-01-03 | Disposition: A | Discharge status: Home / Self Care | Source: Ambulatory Visit | Attending: Obstetrics and Gynecology | Admitting: Obstetrics and Gynecology

## 2024-01-03 VITALS — BP 104/64 | HR 96 | Temp 98.6°F | Ht 64.57 in | Wt 116.2 lb

## 2024-01-03 DIAGNOSIS — Z01419 Encounter for gynecological examination (general) (routine) without abnormal findings: Secondary | ICD-10-CM | POA: Insufficient documentation

## 2024-01-03 DIAGNOSIS — Z975 Presence of (intrauterine) contraceptive device: Secondary | ICD-10-CM | POA: Insufficient documentation

## 2024-01-03 DIAGNOSIS — N951 Menopausal and female climacteric states: Secondary | ICD-10-CM

## 2024-01-03 DIAGNOSIS — Z124 Encounter for screening for malignant neoplasm of cervix: Secondary | ICD-10-CM

## 2024-01-03 DIAGNOSIS — Z1331 Encounter for screening for depression: Secondary | ICD-10-CM | POA: Diagnosis not present

## 2024-01-03 NOTE — Progress Notes (Signed)
 48 y.o. G0P0000 female with Mirena  IUD (inserted 12/08/2022) here for annual exam. 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  No LMP recorded. (Menstrual status: IUD).    PHQ-9: 8.  Noting some marital discord.  Separated from husband January of this year.  Has completed some personal counseling.  Still working through issues. Planning to travel to [place] this year.  Will complete breast cancer screening there via MRI. Requests hormonal testing. No perimenopausal sx, however IUD use.  Abnormal bleeding:none Pelvic discharge or pain: none Breast mass, nipple discharge or skin changes : none Birth control: Mirena  Last PAP:     Component Value Date/Time   DIAGPAP  10/07/2020 1229    - Negative for intraepithelial lesion or malignancy (NILM)   HPVHIGH Negative 10/07/2020 1229   ADEQPAP  10/07/2020 1229    Satisfactory for evaluation; transformation zone component PRESENT.   Last mammogram: 2023, prefers to have breast MRI done in [place] due to density Last colonoscopy: Cologuard, 2023 neg  Sexually active: Yes Exercising: No, vegan diet Smoker: Yes  Flowsheet Row Office Visit from 01/03/2024 in [facility]  PHQ-2 Total Score 2       GYN HISTORY: No significant history  OB History  Gravida Para Term Preterm AB Living  0 0 0 0 0 0  SAB IAB Ectopic Multiple Live Births  0 0 0 0 0    Past Medical History:  Diagnosis Date   Anxiety    Depression    Dysmenorrhea    Hypertension    Kidney stone     History reviewed. No pertinent surgical history.  Current Outpatient Medications on File Prior to Visit  Medication Sig Dispense Refill   Ascorbic Acid (VITAMIN C) 100 MG tablet Take 100 mg by mouth daily.     BIOTIN PO Take 1 tablet by mouth daily.     diphenhydrAMINE (BENADRYL) 25 MG tablet Take 25 mg by mouth every 6 (six) hours as needed for allergies.     escitalopram (LEXAPRO) 20 MG tablet Take 20 mg by mouth daily.  3    levonorgestrel  (MIRENA , 52 MG,) 20 MCG/DAY IUD as directed Intrauterine     Multiple Vitamin (MULTIVITAMIN) tablet Take 1 tablet by mouth daily.     spironolactone (ALDACTONE) 25 MG tablet Take 25 mg by mouth 2 (two) times daily.     spironolactone (ALDACTONE) 50 MG tablet Take 50 mg by mouth daily.     traZODone (DESYREL) 50 MG tablet TAKE 1-2 TABLETS (50-100 MG TOTAL) BY MOUTH NIGHTLY AS NEEDED  FOR SLEEP.     VITAMIN A PO Take 1 tablet by mouth daily.     No current facility-administered medications on file prior to visit.    Social History   Socioeconomic History   Marital status: Married    Spouse name: Not on file   Number of children: Not on file   Years of education: Not on file   Highest education level: Not on file  Occupational History   Not on file  Tobacco Use   Smoking status: Every Day    Current packs/day: 0.00    Types: Cigarettes    Last attempt to quit: 07/31/2014    Years since quitting: 9.4   Smokeless tobacco: Never  Vaping Use   Vaping status: Never Used  Substance and Sexual Activity   Alcohol use: Yes    Comment: 2 per day   Drug use: No   Sexual  activity: Yes    Birth control/protection: I.U.D.  Other Topics Concern   Not on file  Social History Narrative   Not on file   Social Drivers of Health   Financial Resource Strain: Not on file  Food Insecurity: Low Risk  (09/05/2023)   Received from [facility]   Hunger Vital Sign    Worried About Running Out of Food in the Last Year: Never true    Ran Out of Food in the Last Year: Never true  Transportation Needs: No Transportation Needs (09/05/2023)   Received from [facility]    In the past 12 months, has lack of reliable transportation kept you from medical appointments, meetings, work or from getting things needed for daily living? : No  Physical Activity: Not on file  Stress: Not on file  Social Connections: Not on file  Intimate Partner Violence: Not on file     Family History  Problem Relation Age of Onset   Breast cancer Neg Hx     No Known Allergies    PE Today's Vitals   01/03/24 0811  BP: 104/64  Pulse: 96  Temp: 98.6 F (37 C)  TempSrc: Oral  SpO2: 98%  Weight: 116 lb 3.2 oz (52.7 kg)  Height: 5' 4.57" (1.64 m)   Body mass index is 19.6 kg/m.  Physical Exam Vitals reviewed. Exam conducted with a chaperone present.  Constitutional:      General: She is not in acute distress.    Appearance: Normal appearance.  HENT:     Head: Normocephalic and atraumatic.     Nose: Nose normal.  Eyes:     Extraocular Movements: Extraocular movements intact.     Conjunctiva/sclera: Conjunctivae normal.  Neck:     Thyroid: No thyroid mass, thyromegaly or thyroid tenderness.  Pulmonary:     Effort: Pulmonary effort is normal.  Chest:     Chest wall: No mass or tenderness.  Breasts:    Right: Normal. No swelling, mass, nipple discharge, skin change or tenderness.     Left: Normal. No swelling, mass, nipple discharge, skin change or tenderness.  Abdominal:     General: There is no distension.     Palpations: Abdomen is soft.     Tenderness: There is no abdominal tenderness.  Genitourinary:    General: Normal vulva.     Exam position: Lithotomy position.     Urethra: No prolapse.     Vagina: Normal. No vaginal discharge or bleeding.     Cervix: Normal. No cervical motion tenderness, discharge or lesion.     Uterus: Normal. Not enlarged and not tender.      Adnexa: Right adnexa normal and left adnexa normal.     Comments: IUD strings present. Musculoskeletal:        General: Normal range of motion.     Cervical back: Normal range of motion.  Lymphadenopathy:     Upper Body:     Right upper body: No axillary adenopathy.     Left upper body: No axillary adenopathy.     Lower Body: No right inguinal adenopathy. No left inguinal adenopathy.  Skin:    General: Skin is warm and dry.  Neurological:     General: No focal deficit  present.     Mental Status: She is alert.  Psychiatric:        Mood and Affect: Mood normal.        Behavior: Behavior normal.  Assessment and Plan:        Well woman exam with routine gynecological exam Assessment & Plan: Cervical cancer screening performed according to ASCCP guidelines. Encouraged annual mammogram screening Colonoscopy/cologuard UTD DXA N/A Labs and immunizations with her primary Encouraged safe sexual practices as indicated Encouraged healthy lifestyle practices with diet and exercise For patients under 50yo, I recommend 1000mg  calcium daily and 600IU of vitamin D daily.    Cervical cancer screening -     Cytology - PAP  Perimenopause -     Follicle stimulating hormone -     Testos,Total,Free and SHBG (Female) -     Estradiol Labs per pt request  Uses hormone releasing intrauterine device (IUD) for contraception Assessment & Plan: Inserted 2024, due for removal 2032     Romaine Closs, MD

## 2024-01-03 NOTE — Assessment & Plan Note (Signed)
 Inserted 2024, due for removal 2032

## 2024-01-03 NOTE — Patient Instructions (Signed)

## 2024-01-03 NOTE — Assessment & Plan Note (Signed)
 Cervical cancer screening performed according to ASCCP guidelines. Encouraged annual mammogram screening Colonoscopy/cologuard UTD DXA N/A Labs and immunizations with her primary Encouraged safe sexual practices as indicated Encouraged healthy lifestyle practices with diet and exercise For patients under 50yo, I recommend 1000mg  calcium daily and 600IU of vitamin D daily.

## 2024-01-04 ENCOUNTER — Ambulatory Visit: Payer: Self-pay | Admitting: Obstetrics and Gynecology

## 2024-01-04 NOTE — Addendum Note (Signed)
 Addended by: Romaine Closs on: 01/04/2024 08:17 PM   Modules accepted: Level of Service

## 2024-01-08 LAB — ESTRADIOL: Estradiol: 118 pg/mL

## 2024-01-08 LAB — TESTOS,TOTAL,FREE AND SHBG (FEMALE)
Free Testosterone: 0.8 pg/mL (ref 0.1–6.4)
Sex Hormone Binding: 61 nmol/L (ref 17–124)
Testosterone, Total, LC-MS-MS: 11 ng/dL (ref 2–45)

## 2024-01-08 LAB — FOLLICLE STIMULATING HORMONE: FSH: 8.3 m[IU]/mL

## 2024-01-10 LAB — CYTOLOGY - PAP
Comment: NEGATIVE
Diagnosis: NEGATIVE
High risk HPV: NEGATIVE

## 2024-07-16 ENCOUNTER — Other Ambulatory Visit (HOSPITAL_BASED_OUTPATIENT_CLINIC_OR_DEPARTMENT_OTHER): Payer: Self-pay | Admitting: Family Medicine

## 2024-07-16 ENCOUNTER — Ambulatory Visit (HOSPITAL_BASED_OUTPATIENT_CLINIC_OR_DEPARTMENT_OTHER): Admitting: Radiology

## 2024-07-16 ENCOUNTER — Encounter (HOSPITAL_BASED_OUTPATIENT_CLINIC_OR_DEPARTMENT_OTHER): Payer: Self-pay

## 2024-07-16 DIAGNOSIS — R1032 Left lower quadrant pain: Secondary | ICD-10-CM

## 2024-08-21 ENCOUNTER — Encounter (HOSPITAL_BASED_OUTPATIENT_CLINIC_OR_DEPARTMENT_OTHER): Payer: Self-pay | Admitting: Emergency Medicine

## 2024-08-21 ENCOUNTER — Emergency Department (HOSPITAL_BASED_OUTPATIENT_CLINIC_OR_DEPARTMENT_OTHER)
Admission: EM | Admit: 2024-08-21 | Discharge: 2024-08-21 | Disposition: A | Discharge status: Home / Self Care | Attending: Emergency Medicine | Admitting: Emergency Medicine

## 2024-08-21 ENCOUNTER — Other Ambulatory Visit: Payer: Self-pay

## 2024-08-21 DIAGNOSIS — R42 Dizziness and giddiness: Secondary | ICD-10-CM | POA: Diagnosis not present

## 2024-08-21 DIAGNOSIS — R197 Diarrhea, unspecified: Secondary | ICD-10-CM | POA: Insufficient documentation

## 2024-08-21 DIAGNOSIS — R112 Nausea with vomiting, unspecified: Secondary | ICD-10-CM | POA: Diagnosis present

## 2024-08-21 DIAGNOSIS — R109 Unspecified abdominal pain: Secondary | ICD-10-CM | POA: Diagnosis not present

## 2024-08-21 LAB — CBC WITH DIFFERENTIAL/PLATELET
Abs Immature Granulocytes: 0.03 K/uL (ref 0.00–0.07)
Basophils Absolute: 0 K/uL (ref 0.0–0.1)
Basophils Relative: 0 %
Eosinophils Absolute: 0 K/uL (ref 0.0–0.5)
Eosinophils Relative: 0 %
HCT: 43.6 % (ref 36.0–46.0)
Hemoglobin: 15.5 g/dL — ABNORMAL HIGH (ref 12.0–15.0)
Immature Granulocytes: 0 %
Lymphocytes Relative: 2 %
Lymphs Abs: 0.2 K/uL — ABNORMAL LOW (ref 0.7–4.0)
MCH: 32 pg (ref 26.0–34.0)
MCHC: 35.6 g/dL (ref 30.0–36.0)
MCV: 90.1 fL (ref 80.0–100.0)
Monocytes Absolute: 0.3 K/uL (ref 0.1–1.0)
Monocytes Relative: 3 %
Neutro Abs: 8.5 K/uL — ABNORMAL HIGH (ref 1.7–7.7)
Neutrophils Relative %: 95 %
Platelets: 210 K/uL (ref 150–400)
RBC: 4.84 MIL/uL (ref 3.87–5.11)
RDW: 11.9 % (ref 11.5–15.5)
WBC: 9 K/uL (ref 4.0–10.5)
nRBC: 0 % (ref 0.0–0.2)

## 2024-08-21 LAB — RESP PANEL BY RT-PCR (RSV, FLU A&B, COVID)  RVPGX2
Influenza A by PCR: NEGATIVE
Influenza B by PCR: NEGATIVE
Resp Syncytial Virus by PCR: NEGATIVE
SARS Coronavirus 2 by RT PCR: NEGATIVE

## 2024-08-21 LAB — URINALYSIS, ROUTINE W REFLEX MICROSCOPIC
Glucose, UA: NEGATIVE mg/dL
Hgb urine dipstick: NEGATIVE
Ketones, ur: >80 mg/dL — AB
Leukocytes,Ua: NEGATIVE
Nitrite: NEGATIVE
Protein, ur: NEGATIVE mg/dL
Specific Gravity, Urine: >1.03 — ABNORMAL HIGH (ref 1.005–1.030)
pH: 6 (ref 5.0–8.0)

## 2024-08-21 LAB — COMPREHENSIVE METABOLIC PANEL WITH GFR
ALT: 11 U/L (ref 0–44)
AST: 20 U/L (ref 15–41)
Albumin: 4.2 g/dL (ref 3.5–5.0)
Alkaline Phosphatase: 75 U/L (ref 38–126)
Anion gap: 11 (ref 5–15)
BUN: 28 mg/dL — ABNORMAL HIGH (ref 6–20)
CO2: 27 mmol/L (ref 22–32)
Calcium: 9.4 mg/dL (ref 8.9–10.3)
Chloride: 102 mmol/L (ref 98–111)
Creatinine, Ser: 0.7 mg/dL (ref 0.44–1.00)
GFR, Estimated: >60 mL/min
Glucose, Bld: 111 mg/dL — ABNORMAL HIGH (ref 70–99)
Potassium: 4.4 mmol/L (ref 3.5–5.1)
Sodium: 140 mmol/L (ref 135–145)
Total Bilirubin: 0.8 mg/dL (ref 0.0–1.2)
Total Protein: 6.7 g/dL (ref 6.5–8.1)

## 2024-08-21 LAB — LIPASE, BLOOD: Lipase: 16 U/L (ref 11–51)

## 2024-08-21 MED ORDER — SODIUM CHLORIDE 0.9 % IV BOLUS
1000.0000 mL | Freq: Once | INTRAVENOUS | Status: AC
  Administered 2024-08-21: 1000 mL via INTRAVENOUS

## 2024-08-21 MED ORDER — ONDANSETRON HCL 4 MG/2ML IJ SOLN
4.0000 mg | Freq: Once | INTRAMUSCULAR | Status: AC
  Administered 2024-08-21: 4 mg via INTRAVENOUS
  Filled 2024-08-21: qty 2

## 2024-08-21 MED ORDER — DICYCLOMINE HCL 20 MG PO TABS: 20.0000 mg | ORAL_TABLET | Freq: Two times a day (BID) | ORAL | 0 refills | Status: AC

## 2024-08-21 MED ORDER — DICYCLOMINE HCL 10 MG/ML IM SOLN
20.0000 mg | Freq: Once | INTRAMUSCULAR | Status: AC
  Administered 2024-08-21: 20 mg via INTRAMUSCULAR
  Filled 2024-08-21: qty 2

## 2024-08-21 MED ORDER — ONDANSETRON 4 MG PO TBDP: 4.0000 mg | ORAL_TABLET | Freq: Three times a day (TID) | ORAL | 0 refills | Status: AC | PRN

## 2024-08-21 NOTE — ED Triage Notes (Signed)
 Pt here  from home with c/o dizziness along with some n/v , that started on monday , pt did miss a week of her lexapro , no fevers at home

## 2024-08-21 NOTE — ED Provider Notes (Signed)
 " Brooke Aguilar EMERGENCY DEPARTMENT AT [facility] Provider Note   CSN: [id number] Arrival date & time: 08/21/24  9155     Patient presents with: Dizziness, Nausea, and Emesis   Brooke Aguilar is a 49 y.o. female patient who presents to the emergency department today for further evaluation of nausea, vomiting, intermittent diarrhea, and dizziness.  Patient states that she recently unintentionally stopped her Lexapro when she went out of town for approximately 7 days and left her medication at home.  She has been on this medication for years.  Her abdominal symptoms started roughly 24-48 hours ago.  She is also endorsing some abdominal cramping and chills.  She denies any vaginal symptoms, fever, urinary symptoms.   Dizziness Associated symptoms: vomiting   Emesis      Prior to Admission medications  Medication Sig Start Date End Date Taking? Authorizing Provider  dicyclomine  (BENTYL ) 20 MG tablet Take 1 tablet (20 mg total) by mouth 2 (two) times daily. 08/21/24  Yes Theotis, Calyn Sivils M, PA-C  meloxicam (MOBIC) 7.5 MG tablet Take 7.5 mg by mouth daily. 07/16/24 07/16/25 Yes [provider]  ondansetron  (ZOFRAN -ODT) 4 MG disintegrating tablet Take 1 tablet (4 mg total) by mouth every 8 (eight) hours as needed for nausea or vomiting. 08/21/24  Yes Theotis, Keondre Markson M, PA-C  Ascorbic Acid (VITAMIN C) 100 MG tablet Take 100 mg by mouth daily.    [provider]  BIOTIN PO Take 1 tablet by mouth daily.    [provider]  diphenhydrAMINE (BENADRYL) 25 MG tablet Take 25 mg by mouth every 6 (six) hours as needed for allergies.    [provider]  escitalopram (LEXAPRO) 20 MG tablet Take 20 mg by mouth daily. 11/17/16   [provider]  levonorgestrel  (MIRENA , 52 MG,) 20 MCG/DAY IUD as directed Intrauterine    [provider]  Multiple Vitamin (MULTIVITAMIN) tablet Take 1 tablet by mouth daily.    [provider]  spironolactone  (ALDACTONE) 25 MG tablet Take 25 mg by mouth 2 (two) times daily.    [provider]  spironolactone (ALDACTONE) 50 MG tablet Take 50 mg by mouth daily.    [provider]  traZODone (DESYREL) 50 MG tablet TAKE 1-2 TABLETS (50-100 MG TOTAL) BY MOUTH NIGHTLY AS NEEDED  FOR SLEEP. 08/02/18   [provider]  VITAMIN A PO Take 1 tablet by mouth daily.    [provider]    Allergies: Patient has no known allergies.    Review of Systems  Gastrointestinal:  Positive for vomiting.  Neurological:  Positive for dizziness.  All other systems reviewed and are negative.   Updated Vital Signs BP (!) 150/109 (BP Location: Right Arm)   Pulse 99   Temp 98.2 F (36.8 C) (Oral)   Resp 18   Ht 5' 4 (1.626 m)   Wt 54.4 kg   SpO2 100%   BMI 20.60 kg/m   Physical Exam Vitals and nursing note reviewed.  Constitutional:      Appearance: Normal appearance.  HENT:     Head: Normocephalic and atraumatic.  Eyes:     General:        Right eye: No discharge.        Left eye: No discharge.     Conjunctiva/sclera: Conjunctivae normal.  Pulmonary:     Effort: Pulmonary effort is normal.  Skin:    General: Skin is warm and dry.     Findings: No rash.  Neurological:  General: No focal deficit present.     Mental Status: She is alert.  Psychiatric:        Mood and Affect: Mood normal.        Behavior: Behavior normal.     (all labs ordered are listed, but only abnormal results are displayed) Labs Reviewed  CBC WITH DIFFERENTIAL/PLATELET - Abnormal; Notable for the following components:      Result Value   Hemoglobin 15.5 (*)    Neutro Abs 8.5 (*)    Lymphs Abs 0.2 (*)    All other components within normal limits  COMPREHENSIVE METABOLIC PANEL WITH GFR - Abnormal; Notable for the following components:   Glucose, Bld 111 (*)    BUN 28 (*)    All other components within normal limits  URINALYSIS, ROUTINE W REFLEX MICROSCOPIC - Abnormal; Notable for the  following components:   Specific Gravity, Urine >1.030 (*)    Bilirubin Urine SMALL (*)    Ketones, ur >80 (*)    All other components within normal limits  RESP PANEL BY RT-PCR (RSV, FLU A&B, COVID)  [id number]  LIPASE, BLOOD    EKG: None  Radiology: No results found.   Procedures   Medications Ordered in the ED  sodium chloride  0.9 % bolus 1,000 mL (1,000 mLs Intravenous New Bag/Given 08/21/24 1008)  ondansetron  (ZOFRAN ) injection 4 mg (4 mg Intravenous Given 08/21/24 1009)  dicyclomine  (BENTYL ) injection 20 mg (20 mg Intramuscular Given 08/21/24 1010)    Clinical Course as of 08/21/24 1113  Wed Aug 21, 2024  1108 On repeat evaluation, patient is feeling better.  She states her abdominal cramps are slightly improved after Bentyl  and her nausea is improved.  She has had no vomiting since has been here.  Patient has not taken her Lexapro today.  I suspect given she has a negative respiratory panel swab this is likely Lexapro withdrawal.  She was starting to have some paresthesias in her fingers bilaterally on reevaluation.  Plan to restart her Lexapro at full dose and get into see her primary care doctor to make a decision whether not to remain at this dose or start to taper off.  This was accidental as she left it at home so she could resume her Lexapro normally.  Will prescribe her Zofran  and Bentyl  to go home with.  Patient agreeable with plan.  Strict turn precaution were discussed.   [CF]  1109 Urinalysis, Routine w reflex microscopic -Urine, Clean Catch(!) No signs of urinary tract infection. [CF]  1109 CBC with Differential(!) Negative. [CF]  1109 Comprehensive metabolic panel(!) Negative. [CF]  1109 Lipase, blood Negative. [CF]  1109 Resp panel by RT-PCR (RSV, Flu A&B, Covid) Anterior Nasal Swab Negative. [CF]    Clinical Course User Index [CF] Theotis Cameron HERO, PA-C    Medical Decision Making Brooke Aguilar is a 49 y.o. female patient who presents to the emergency  department today for further evaluation of nausea, vomiting, and intermittent diarrhea.  Given that the patient abruptly stopped her Lexapro this is certainly on the differential.  Could also be a viral illness.  Will plan to get basic labs to look for signs of dehydration or electrolyte abnormalities, urinalysis for urinary tract infection.  Will give her some Zofran  and Bentyl  for nausea and vomiting and abdominal cramps respectively and some fluids.  Will plan to reassess.  As highlighted in the ED course, patient was now having some paresthesias and with a negative respiratory panel I am suspicious for  SSRI withdrawal.  Plan to restart her current dose at home.  Will give her some Zofran  and Bentyl  to go home with.  I will have her follow-up with her primary care doctor for further evaluation.  Strict turn precautions were discussed.  She is safe for discharge.   Amount and/or Complexity of Data Reviewed Labs: ordered. Decision-making details documented in ED Course.  Risk Prescription drug management.    Final diagnoses:  Nausea and vomiting, unspecified vomiting type  Abdominal cramping    ED Discharge Orders          Ordered    dicyclomine  (BENTYL ) 20 MG tablet  2 times daily        08/21/24 1111    ondansetron  (ZOFRAN -ODT) 4 MG disintegrating tablet  Every 8 hours PRN        08/21/24 1111               Theotis [place], [place] 08/21/24 1113    Jerrol Agent, MD 08/21/24 1125  "

## 2024-08-21 NOTE — Discharge Instructions (Signed)
 I have this is likely withdrawal from your SSRI (Lexapro).  Please restart at your current dose once you feel that you can tolerate it and keep it down.  You can follow-up with your primary care doctor.  Take Zofran  for nausea and vomiting and Bentyl  for abdominal cramping as prescribed.  You may return to the emergency department for any worsening symptoms.
